# Patient Record
Sex: Female | Born: 1940
Health system: Southern US, Community
[De-identification: ages and names within clinical notes are randomized; demographics above are authoritative.]

## PROBLEM LIST (undated history)

## (undated) DIAGNOSIS — I10 Essential (primary) hypertension: Secondary | ICD-10-CM

## (undated) DIAGNOSIS — I509 Heart failure, unspecified: Secondary | ICD-10-CM

## (undated) DIAGNOSIS — C801 Malignant (primary) neoplasm, unspecified: Secondary | ICD-10-CM

## (undated) DIAGNOSIS — N189 Chronic kidney disease, unspecified: Secondary | ICD-10-CM

## (undated) HISTORY — DX: Chronic kidney disease, unspecified: N18.9

## (undated) HISTORY — PX: CORONARY ARTERY BYPASS GRAFT: SHX141

## (undated) HISTORY — PX: COLONOSCOPY: SHX174

## (undated) HISTORY — DX: Heart failure, unspecified: I50.9

## (undated) HISTORY — DX: Essential (primary) hypertension: I10

---

## 2012-06-09 DIAGNOSIS — E782 Mixed hyperlipidemia: Secondary | ICD-10-CM | POA: Diagnosis not present

## 2012-09-07 DIAGNOSIS — M778 Other enthesopathies, not elsewhere classified: Secondary | ICD-10-CM | POA: Diagnosis not present

## 2012-09-07 DIAGNOSIS — E039 Hypothyroidism, unspecified: Secondary | ICD-10-CM | POA: Diagnosis not present

## 2012-12-12 DIAGNOSIS — E039 Hypothyroidism, unspecified: Secondary | ICD-10-CM | POA: Diagnosis not present

## 2012-12-12 DIAGNOSIS — I10 Essential (primary) hypertension: Secondary | ICD-10-CM | POA: Diagnosis not present

## 2012-12-12 DIAGNOSIS — Z0000 Encounter for general adult medical examination without abnormal findings: Secondary | ICD-10-CM | POA: Diagnosis not present

## 2013-01-27 DIAGNOSIS — J90 Pleural effusion, not elsewhere classified: Secondary | ICD-10-CM | POA: Diagnosis not present

## 2013-01-27 DIAGNOSIS — Z8249 Family history of ischemic heart disease and other diseases of the circulatory system: Secondary | ICD-10-CM | POA: Diagnosis not present

## 2013-01-27 DIAGNOSIS — Z79899 Other long term (current) drug therapy: Secondary | ICD-10-CM | POA: Diagnosis not present

## 2013-01-27 DIAGNOSIS — R0602 Shortness of breath: Secondary | ICD-10-CM | POA: Diagnosis not present

## 2013-01-27 DIAGNOSIS — I2510 Atherosclerotic heart disease of native coronary artery without angina pectoris: Secondary | ICD-10-CM | POA: Diagnosis not present

## 2013-01-27 DIAGNOSIS — I509 Heart failure, unspecified: Secondary | ICD-10-CM | POA: Diagnosis not present

## 2013-01-27 DIAGNOSIS — Z888 Allergy status to other drugs, medicaments and biological substances status: Secondary | ICD-10-CM | POA: Diagnosis not present

## 2013-01-27 DIAGNOSIS — N289 Disorder of kidney and ureter, unspecified: Secondary | ICD-10-CM | POA: Diagnosis not present

## 2013-01-27 DIAGNOSIS — N390 Urinary tract infection, site not specified: Secondary | ICD-10-CM | POA: Diagnosis not present

## 2013-01-27 DIAGNOSIS — Z9861 Coronary angioplasty status: Secondary | ICD-10-CM | POA: Diagnosis not present

## 2013-01-27 DIAGNOSIS — Z87891 Personal history of nicotine dependence: Secondary | ICD-10-CM | POA: Diagnosis not present

## 2013-01-27 DIAGNOSIS — E039 Hypothyroidism, unspecified: Secondary | ICD-10-CM | POA: Diagnosis not present

## 2013-01-27 DIAGNOSIS — I129 Hypertensive chronic kidney disease with stage 1 through stage 4 chronic kidney disease, or unspecified chronic kidney disease: Secondary | ICD-10-CM | POA: Diagnosis not present

## 2013-01-27 DIAGNOSIS — E785 Hyperlipidemia, unspecified: Secondary | ICD-10-CM | POA: Diagnosis not present

## 2013-01-27 DIAGNOSIS — Z23 Encounter for immunization: Secondary | ICD-10-CM | POA: Diagnosis not present

## 2013-01-27 DIAGNOSIS — Z7982 Long term (current) use of aspirin: Secondary | ICD-10-CM | POA: Diagnosis not present

## 2013-01-27 DIAGNOSIS — D649 Anemia, unspecified: Secondary | ICD-10-CM | POA: Diagnosis not present

## 2013-01-27 DIAGNOSIS — I4891 Unspecified atrial fibrillation: Secondary | ICD-10-CM | POA: Diagnosis not present

## 2013-01-27 DIAGNOSIS — Z780 Asymptomatic menopausal state: Secondary | ICD-10-CM | POA: Diagnosis not present

## 2013-01-28 DIAGNOSIS — I129 Hypertensive chronic kidney disease with stage 1 through stage 4 chronic kidney disease, or unspecified chronic kidney disease: Secondary | ICD-10-CM | POA: Diagnosis not present

## 2013-01-28 DIAGNOSIS — R0602 Shortness of breath: Secondary | ICD-10-CM | POA: Diagnosis not present

## 2013-01-28 DIAGNOSIS — I509 Heart failure, unspecified: Secondary | ICD-10-CM | POA: Diagnosis not present

## 2013-01-28 DIAGNOSIS — N390 Urinary tract infection, site not specified: Secondary | ICD-10-CM | POA: Diagnosis not present

## 2013-01-29 DIAGNOSIS — R0602 Shortness of breath: Secondary | ICD-10-CM | POA: Diagnosis not present

## 2013-01-29 DIAGNOSIS — N390 Urinary tract infection, site not specified: Secondary | ICD-10-CM | POA: Diagnosis not present

## 2013-01-29 DIAGNOSIS — I509 Heart failure, unspecified: Secondary | ICD-10-CM | POA: Diagnosis not present

## 2013-01-29 DIAGNOSIS — I129 Hypertensive chronic kidney disease with stage 1 through stage 4 chronic kidney disease, or unspecified chronic kidney disease: Secondary | ICD-10-CM | POA: Diagnosis not present

## 2013-02-12 DIAGNOSIS — I10 Essential (primary) hypertension: Secondary | ICD-10-CM | POA: Diagnosis not present

## 2013-05-21 DIAGNOSIS — E039 Hypothyroidism, unspecified: Secondary | ICD-10-CM | POA: Diagnosis not present

## 2013-05-21 DIAGNOSIS — J209 Acute bronchitis, unspecified: Secondary | ICD-10-CM | POA: Diagnosis not present

## 2013-05-21 DIAGNOSIS — E782 Mixed hyperlipidemia: Secondary | ICD-10-CM | POA: Diagnosis not present

## 2013-05-21 DIAGNOSIS — G932 Benign intracranial hypertension: Secondary | ICD-10-CM | POA: Diagnosis not present

## 2013-08-21 DIAGNOSIS — I509 Heart failure, unspecified: Secondary | ICD-10-CM | POA: Diagnosis not present

## 2013-08-21 DIAGNOSIS — I10 Essential (primary) hypertension: Secondary | ICD-10-CM | POA: Diagnosis not present

## 2013-08-21 DIAGNOSIS — J45909 Unspecified asthma, uncomplicated: Secondary | ICD-10-CM | POA: Diagnosis not present

## 2013-11-26 DIAGNOSIS — I10 Essential (primary) hypertension: Secondary | ICD-10-CM | POA: Diagnosis not present

## 2014-03-04 DIAGNOSIS — Z0000 Encounter for general adult medical examination without abnormal findings: Secondary | ICD-10-CM | POA: Diagnosis not present

## 2014-03-04 DIAGNOSIS — I10 Essential (primary) hypertension: Secondary | ICD-10-CM | POA: Diagnosis not present

## 2014-03-04 DIAGNOSIS — Z79899 Other long term (current) drug therapy: Secondary | ICD-10-CM | POA: Diagnosis not present

## 2014-03-04 DIAGNOSIS — Z1389 Encounter for screening for other disorder: Secondary | ICD-10-CM | POA: Diagnosis not present

## 2014-03-04 DIAGNOSIS — J309 Allergic rhinitis, unspecified: Secondary | ICD-10-CM | POA: Diagnosis not present

## 2014-05-24 DIAGNOSIS — Z1231 Encounter for screening mammogram for malignant neoplasm of breast: Secondary | ICD-10-CM | POA: Diagnosis not present

## 2014-06-21 DIAGNOSIS — I10 Essential (primary) hypertension: Secondary | ICD-10-CM | POA: Diagnosis not present

## 2014-06-21 DIAGNOSIS — J189 Pneumonia, unspecified organism: Secondary | ICD-10-CM | POA: Diagnosis not present

## 2015-04-25 DIAGNOSIS — D539 Nutritional anemia, unspecified: Secondary | ICD-10-CM | POA: Diagnosis not present

## 2015-04-25 DIAGNOSIS — N289 Disorder of kidney and ureter, unspecified: Secondary | ICD-10-CM | POA: Diagnosis not present

## 2015-04-25 DIAGNOSIS — R05 Cough: Secondary | ICD-10-CM | POA: Diagnosis not present

## 2015-04-25 DIAGNOSIS — I5021 Acute systolic (congestive) heart failure: Secondary | ICD-10-CM | POA: Diagnosis not present

## 2015-04-25 DIAGNOSIS — E785 Hyperlipidemia, unspecified: Secondary | ICD-10-CM | POA: Diagnosis not present

## 2015-04-25 DIAGNOSIS — R0602 Shortness of breath: Secondary | ICD-10-CM | POA: Diagnosis not present

## 2015-04-25 DIAGNOSIS — E039 Hypothyroidism, unspecified: Secondary | ICD-10-CM | POA: Diagnosis not present

## 2015-04-25 DIAGNOSIS — R748 Abnormal levels of other serum enzymes: Secondary | ICD-10-CM | POA: Diagnosis not present

## 2015-04-25 DIAGNOSIS — I4891 Unspecified atrial fibrillation: Secondary | ICD-10-CM | POA: Diagnosis not present

## 2015-04-25 DIAGNOSIS — I5023 Acute on chronic systolic (congestive) heart failure: Secondary | ICD-10-CM | POA: Diagnosis not present

## 2015-04-25 DIAGNOSIS — N184 Chronic kidney disease, stage 4 (severe): Secondary | ICD-10-CM | POA: Diagnosis not present

## 2015-04-25 DIAGNOSIS — I129 Hypertensive chronic kidney disease with stage 1 through stage 4 chronic kidney disease, or unspecified chronic kidney disease: Secondary | ICD-10-CM | POA: Diagnosis not present

## 2015-04-25 DIAGNOSIS — I509 Heart failure, unspecified: Secondary | ICD-10-CM | POA: Diagnosis not present

## 2015-04-26 DIAGNOSIS — E038 Other specified hypothyroidism: Secondary | ICD-10-CM | POA: Diagnosis not present

## 2015-04-26 DIAGNOSIS — N183 Chronic kidney disease, stage 3 (moderate): Secondary | ICD-10-CM | POA: Diagnosis not present

## 2015-04-26 DIAGNOSIS — I482 Chronic atrial fibrillation: Secondary | ICD-10-CM | POA: Diagnosis not present

## 2015-04-26 DIAGNOSIS — I5023 Acute on chronic systolic (congestive) heart failure: Secondary | ICD-10-CM | POA: Diagnosis not present

## 2015-04-26 DIAGNOSIS — N184 Chronic kidney disease, stage 4 (severe): Secondary | ICD-10-CM | POA: Diagnosis not present

## 2015-04-27 DIAGNOSIS — E785 Hyperlipidemia, unspecified: Secondary | ICD-10-CM | POA: Diagnosis present

## 2015-04-27 DIAGNOSIS — I129 Hypertensive chronic kidney disease with stage 1 through stage 4 chronic kidney disease, or unspecified chronic kidney disease: Secondary | ICD-10-CM | POA: Diagnosis present

## 2015-04-27 DIAGNOSIS — E039 Hypothyroidism, unspecified: Secondary | ICD-10-CM | POA: Diagnosis present

## 2015-04-27 DIAGNOSIS — D631 Anemia in chronic kidney disease: Secondary | ICD-10-CM | POA: Diagnosis present

## 2015-04-27 DIAGNOSIS — I5023 Acute on chronic systolic (congestive) heart failure: Secondary | ICD-10-CM | POA: Diagnosis not present

## 2015-04-27 DIAGNOSIS — I10 Essential (primary) hypertension: Secondary | ICD-10-CM | POA: Diagnosis present

## 2015-04-27 DIAGNOSIS — I4891 Unspecified atrial fibrillation: Secondary | ICD-10-CM | POA: Diagnosis present

## 2015-04-27 DIAGNOSIS — E038 Other specified hypothyroidism: Secondary | ICD-10-CM | POA: Diagnosis not present

## 2015-04-27 DIAGNOSIS — I482 Chronic atrial fibrillation: Secondary | ICD-10-CM | POA: Diagnosis not present

## 2015-04-27 DIAGNOSIS — Z780 Asymptomatic menopausal state: Secondary | ICD-10-CM | POA: Diagnosis not present

## 2015-04-27 DIAGNOSIS — N184 Chronic kidney disease, stage 4 (severe): Secondary | ICD-10-CM | POA: Diagnosis not present

## 2015-04-27 DIAGNOSIS — I5021 Acute systolic (congestive) heart failure: Secondary | ICD-10-CM | POA: Diagnosis not present

## 2015-04-27 DIAGNOSIS — N183 Chronic kidney disease, stage 3 (moderate): Secondary | ICD-10-CM | POA: Diagnosis not present

## 2015-05-01 DIAGNOSIS — I4891 Unspecified atrial fibrillation: Secondary | ICD-10-CM | POA: Diagnosis not present

## 2015-05-01 DIAGNOSIS — I110 Hypertensive heart disease with heart failure: Secondary | ICD-10-CM | POA: Diagnosis not present

## 2015-05-01 DIAGNOSIS — M6281 Muscle weakness (generalized): Secondary | ICD-10-CM | POA: Diagnosis not present

## 2015-05-01 DIAGNOSIS — R269 Unspecified abnormalities of gait and mobility: Secondary | ICD-10-CM | POA: Diagnosis not present

## 2015-05-01 DIAGNOSIS — I509 Heart failure, unspecified: Secondary | ICD-10-CM | POA: Diagnosis not present

## 2015-05-05 DIAGNOSIS — I110 Hypertensive heart disease with heart failure: Secondary | ICD-10-CM | POA: Diagnosis not present

## 2015-05-05 DIAGNOSIS — R269 Unspecified abnormalities of gait and mobility: Secondary | ICD-10-CM | POA: Diagnosis not present

## 2015-05-05 DIAGNOSIS — I4891 Unspecified atrial fibrillation: Secondary | ICD-10-CM | POA: Diagnosis not present

## 2015-05-05 DIAGNOSIS — M6281 Muscle weakness (generalized): Secondary | ICD-10-CM | POA: Diagnosis not present

## 2015-05-05 DIAGNOSIS — I509 Heart failure, unspecified: Secondary | ICD-10-CM | POA: Diagnosis not present

## 2015-05-07 DIAGNOSIS — R269 Unspecified abnormalities of gait and mobility: Secondary | ICD-10-CM | POA: Diagnosis not present

## 2015-05-07 DIAGNOSIS — I4891 Unspecified atrial fibrillation: Secondary | ICD-10-CM | POA: Diagnosis not present

## 2015-05-07 DIAGNOSIS — I110 Hypertensive heart disease with heart failure: Secondary | ICD-10-CM | POA: Diagnosis not present

## 2015-05-07 DIAGNOSIS — I509 Heart failure, unspecified: Secondary | ICD-10-CM | POA: Diagnosis not present

## 2015-05-07 DIAGNOSIS — M6281 Muscle weakness (generalized): Secondary | ICD-10-CM | POA: Diagnosis not present

## 2015-05-09 DIAGNOSIS — N184 Chronic kidney disease, stage 4 (severe): Secondary | ICD-10-CM | POA: Diagnosis not present

## 2015-05-09 DIAGNOSIS — Z1389 Encounter for screening for other disorder: Secondary | ICD-10-CM | POA: Diagnosis not present

## 2015-05-09 DIAGNOSIS — I5021 Acute systolic (congestive) heart failure: Secondary | ICD-10-CM | POA: Diagnosis not present

## 2015-05-09 DIAGNOSIS — I481 Persistent atrial fibrillation: Secondary | ICD-10-CM | POA: Diagnosis not present

## 2015-05-09 DIAGNOSIS — Z0000 Encounter for general adult medical examination without abnormal findings: Secondary | ICD-10-CM | POA: Diagnosis not present

## 2015-05-12 DIAGNOSIS — I110 Hypertensive heart disease with heart failure: Secondary | ICD-10-CM | POA: Diagnosis not present

## 2015-05-12 DIAGNOSIS — I4891 Unspecified atrial fibrillation: Secondary | ICD-10-CM | POA: Diagnosis not present

## 2015-05-12 DIAGNOSIS — R269 Unspecified abnormalities of gait and mobility: Secondary | ICD-10-CM | POA: Diagnosis not present

## 2015-05-12 DIAGNOSIS — I509 Heart failure, unspecified: Secondary | ICD-10-CM | POA: Diagnosis not present

## 2015-05-12 DIAGNOSIS — M6281 Muscle weakness (generalized): Secondary | ICD-10-CM | POA: Diagnosis not present

## 2015-05-14 DIAGNOSIS — I110 Hypertensive heart disease with heart failure: Secondary | ICD-10-CM | POA: Diagnosis not present

## 2015-05-14 DIAGNOSIS — R269 Unspecified abnormalities of gait and mobility: Secondary | ICD-10-CM | POA: Diagnosis not present

## 2015-05-14 DIAGNOSIS — M6281 Muscle weakness (generalized): Secondary | ICD-10-CM | POA: Diagnosis not present

## 2015-05-14 DIAGNOSIS — I509 Heart failure, unspecified: Secondary | ICD-10-CM | POA: Diagnosis not present

## 2015-05-14 DIAGNOSIS — I4891 Unspecified atrial fibrillation: Secondary | ICD-10-CM | POA: Diagnosis not present

## 2015-05-19 DIAGNOSIS — R269 Unspecified abnormalities of gait and mobility: Secondary | ICD-10-CM | POA: Diagnosis not present

## 2015-05-19 DIAGNOSIS — I4891 Unspecified atrial fibrillation: Secondary | ICD-10-CM | POA: Diagnosis not present

## 2015-05-19 DIAGNOSIS — I509 Heart failure, unspecified: Secondary | ICD-10-CM | POA: Diagnosis not present

## 2015-05-19 DIAGNOSIS — M6281 Muscle weakness (generalized): Secondary | ICD-10-CM | POA: Diagnosis not present

## 2015-05-19 DIAGNOSIS — I110 Hypertensive heart disease with heart failure: Secondary | ICD-10-CM | POA: Diagnosis not present

## 2015-05-21 DIAGNOSIS — R269 Unspecified abnormalities of gait and mobility: Secondary | ICD-10-CM | POA: Diagnosis not present

## 2015-05-21 DIAGNOSIS — M6281 Muscle weakness (generalized): Secondary | ICD-10-CM | POA: Diagnosis not present

## 2015-05-21 DIAGNOSIS — I4891 Unspecified atrial fibrillation: Secondary | ICD-10-CM | POA: Diagnosis not present

## 2015-05-21 DIAGNOSIS — I509 Heart failure, unspecified: Secondary | ICD-10-CM | POA: Diagnosis not present

## 2015-05-21 DIAGNOSIS — I110 Hypertensive heart disease with heart failure: Secondary | ICD-10-CM | POA: Diagnosis not present

## 2015-08-11 DIAGNOSIS — Z131 Encounter for screening for diabetes mellitus: Secondary | ICD-10-CM | POA: Diagnosis not present

## 2015-08-11 DIAGNOSIS — N184 Chronic kidney disease, stage 4 (severe): Secondary | ICD-10-CM | POA: Diagnosis not present

## 2015-08-11 DIAGNOSIS — I481 Persistent atrial fibrillation: Secondary | ICD-10-CM | POA: Diagnosis not present

## 2015-08-11 DIAGNOSIS — I5021 Acute systolic (congestive) heart failure: Secondary | ICD-10-CM | POA: Diagnosis not present

## 2015-12-16 DIAGNOSIS — N184 Chronic kidney disease, stage 4 (severe): Secondary | ICD-10-CM | POA: Diagnosis not present

## 2015-12-16 DIAGNOSIS — Z131 Encounter for screening for diabetes mellitus: Secondary | ICD-10-CM | POA: Diagnosis not present

## 2015-12-16 DIAGNOSIS — I481 Persistent atrial fibrillation: Secondary | ICD-10-CM | POA: Diagnosis not present

## 2015-12-16 DIAGNOSIS — J4521 Mild intermittent asthma with (acute) exacerbation: Secondary | ICD-10-CM | POA: Diagnosis not present

## 2015-12-16 DIAGNOSIS — I5022 Chronic systolic (congestive) heart failure: Secondary | ICD-10-CM | POA: Diagnosis not present

## 2016-02-19 DIAGNOSIS — J9801 Acute bronchospasm: Secondary | ICD-10-CM | POA: Diagnosis not present

## 2016-02-19 DIAGNOSIS — J209 Acute bronchitis, unspecified: Secondary | ICD-10-CM | POA: Diagnosis not present

## 2016-02-19 DIAGNOSIS — I169 Hypertensive crisis, unspecified: Secondary | ICD-10-CM | POA: Diagnosis not present

## 2016-02-20 DIAGNOSIS — Z8673 Personal history of transient ischemic attack (TIA), and cerebral infarction without residual deficits: Secondary | ICD-10-CM | POA: Diagnosis not present

## 2016-02-20 DIAGNOSIS — I517 Cardiomegaly: Secondary | ICD-10-CM | POA: Diagnosis not present

## 2016-02-20 DIAGNOSIS — I482 Chronic atrial fibrillation: Secondary | ICD-10-CM | POA: Diagnosis not present

## 2016-02-20 DIAGNOSIS — Z7902 Long term (current) use of antithrombotics/antiplatelets: Secondary | ICD-10-CM | POA: Diagnosis not present

## 2016-02-20 DIAGNOSIS — E039 Hypothyroidism, unspecified: Secondary | ICD-10-CM | POA: Diagnosis not present

## 2016-02-20 DIAGNOSIS — M1990 Unspecified osteoarthritis, unspecified site: Secondary | ICD-10-CM | POA: Diagnosis not present

## 2016-02-20 DIAGNOSIS — J4521 Mild intermittent asthma with (acute) exacerbation: Secondary | ICD-10-CM | POA: Diagnosis not present

## 2016-02-20 DIAGNOSIS — I252 Old myocardial infarction: Secondary | ICD-10-CM | POA: Diagnosis not present

## 2016-02-20 DIAGNOSIS — Z79899 Other long term (current) drug therapy: Secondary | ICD-10-CM | POA: Diagnosis not present

## 2016-02-20 DIAGNOSIS — I10 Essential (primary) hypertension: Secondary | ICD-10-CM | POA: Diagnosis not present

## 2016-02-20 DIAGNOSIS — R0602 Shortness of breath: Secondary | ICD-10-CM | POA: Diagnosis not present

## 2016-02-20 DIAGNOSIS — E785 Hyperlipidemia, unspecified: Secondary | ICD-10-CM | POA: Diagnosis not present

## 2016-05-17 DIAGNOSIS — I481 Persistent atrial fibrillation: Secondary | ICD-10-CM | POA: Diagnosis not present

## 2016-05-17 DIAGNOSIS — Z1389 Encounter for screening for other disorder: Secondary | ICD-10-CM | POA: Diagnosis not present

## 2016-05-17 DIAGNOSIS — N184 Chronic kidney disease, stage 4 (severe): Secondary | ICD-10-CM | POA: Diagnosis not present

## 2016-05-17 DIAGNOSIS — I5022 Chronic systolic (congestive) heart failure: Secondary | ICD-10-CM | POA: Diagnosis not present

## 2016-05-17 DIAGNOSIS — J4521 Mild intermittent asthma with (acute) exacerbation: Secondary | ICD-10-CM | POA: Diagnosis not present

## 2016-05-17 DIAGNOSIS — Z0000 Encounter for general adult medical examination without abnormal findings: Secondary | ICD-10-CM | POA: Diagnosis not present

## 2016-06-03 DIAGNOSIS — I10 Essential (primary) hypertension: Secondary | ICD-10-CM | POA: Diagnosis not present

## 2016-06-03 DIAGNOSIS — H40013 Open angle with borderline findings, low risk, bilateral: Secondary | ICD-10-CM | POA: Diagnosis not present

## 2016-06-03 DIAGNOSIS — H25013 Cortical age-related cataract, bilateral: Secondary | ICD-10-CM | POA: Diagnosis not present

## 2016-06-03 DIAGNOSIS — H524 Presbyopia: Secondary | ICD-10-CM | POA: Diagnosis not present

## 2016-06-03 DIAGNOSIS — H35033 Hypertensive retinopathy, bilateral: Secondary | ICD-10-CM | POA: Diagnosis not present

## 2016-06-03 DIAGNOSIS — H11153 Pinguecula, bilateral: Secondary | ICD-10-CM | POA: Diagnosis not present

## 2016-06-03 DIAGNOSIS — H18413 Arcus senilis, bilateral: Secondary | ICD-10-CM | POA: Diagnosis not present

## 2016-06-03 DIAGNOSIS — H2513 Age-related nuclear cataract, bilateral: Secondary | ICD-10-CM | POA: Diagnosis not present

## 2016-06-14 DIAGNOSIS — I481 Persistent atrial fibrillation: Secondary | ICD-10-CM | POA: Diagnosis not present

## 2016-06-14 DIAGNOSIS — Z79899 Other long term (current) drug therapy: Secondary | ICD-10-CM | POA: Diagnosis not present

## 2016-06-14 DIAGNOSIS — N184 Chronic kidney disease, stage 4 (severe): Secondary | ICD-10-CM | POA: Diagnosis not present

## 2016-06-14 DIAGNOSIS — J4521 Mild intermittent asthma with (acute) exacerbation: Secondary | ICD-10-CM | POA: Diagnosis not present

## 2016-06-14 DIAGNOSIS — I5022 Chronic systolic (congestive) heart failure: Secondary | ICD-10-CM | POA: Diagnosis not present

## 2016-09-16 DIAGNOSIS — I481 Persistent atrial fibrillation: Secondary | ICD-10-CM | POA: Diagnosis not present

## 2016-09-16 DIAGNOSIS — M85852 Other specified disorders of bone density and structure, left thigh: Secondary | ICD-10-CM | POA: Diagnosis not present

## 2016-09-16 DIAGNOSIS — M810 Age-related osteoporosis without current pathological fracture: Secondary | ICD-10-CM | POA: Diagnosis not present

## 2016-09-16 DIAGNOSIS — J4521 Mild intermittent asthma with (acute) exacerbation: Secondary | ICD-10-CM | POA: Diagnosis not present

## 2016-09-16 DIAGNOSIS — N184 Chronic kidney disease, stage 4 (severe): Secondary | ICD-10-CM | POA: Diagnosis not present

## 2016-09-16 DIAGNOSIS — I5022 Chronic systolic (congestive) heart failure: Secondary | ICD-10-CM | POA: Diagnosis not present

## 2017-09-27 DIAGNOSIS — I5022 Chronic systolic (congestive) heart failure: Secondary | ICD-10-CM | POA: Diagnosis not present

## 2017-09-27 DIAGNOSIS — I10 Essential (primary) hypertension: Secondary | ICD-10-CM | POA: Diagnosis not present

## 2017-09-27 DIAGNOSIS — J4521 Mild intermittent asthma with (acute) exacerbation: Secondary | ICD-10-CM | POA: Diagnosis not present

## 2017-09-27 DIAGNOSIS — I481 Persistent atrial fibrillation: Secondary | ICD-10-CM | POA: Diagnosis not present

## 2017-09-27 DIAGNOSIS — N184 Chronic kidney disease, stage 4 (severe): Secondary | ICD-10-CM | POA: Diagnosis not present

## 2017-09-27 DIAGNOSIS — Z6832 Body mass index (BMI) 32.0-32.9, adult: Secondary | ICD-10-CM | POA: Diagnosis not present

## 2018-01-09 DIAGNOSIS — I10 Essential (primary) hypertension: Secondary | ICD-10-CM | POA: Diagnosis not present

## 2018-01-09 DIAGNOSIS — Z6832 Body mass index (BMI) 32.0-32.9, adult: Secondary | ICD-10-CM | POA: Diagnosis not present

## 2018-01-09 DIAGNOSIS — Z0000 Encounter for general adult medical examination without abnormal findings: Secondary | ICD-10-CM | POA: Diagnosis not present

## 2018-01-09 DIAGNOSIS — N184 Chronic kidney disease, stage 4 (severe): Secondary | ICD-10-CM | POA: Diagnosis not present

## 2018-01-09 DIAGNOSIS — I481 Persistent atrial fibrillation: Secondary | ICD-10-CM | POA: Diagnosis not present

## 2018-01-09 DIAGNOSIS — Z1389 Encounter for screening for other disorder: Secondary | ICD-10-CM | POA: Diagnosis not present

## 2018-01-09 DIAGNOSIS — I5022 Chronic systolic (congestive) heart failure: Secondary | ICD-10-CM | POA: Diagnosis not present

## 2018-01-09 DIAGNOSIS — J4521 Mild intermittent asthma with (acute) exacerbation: Secondary | ICD-10-CM | POA: Diagnosis not present

## 2018-02-28 DIAGNOSIS — I129 Hypertensive chronic kidney disease with stage 1 through stage 4 chronic kidney disease, or unspecified chronic kidney disease: Secondary | ICD-10-CM | POA: Diagnosis not present

## 2018-02-28 DIAGNOSIS — N189 Chronic kidney disease, unspecified: Secondary | ICD-10-CM | POA: Diagnosis not present

## 2018-02-28 DIAGNOSIS — J45909 Unspecified asthma, uncomplicated: Secondary | ICD-10-CM | POA: Diagnosis not present

## 2018-02-28 DIAGNOSIS — Z6834 Body mass index (BMI) 34.0-34.9, adult: Secondary | ICD-10-CM | POA: Diagnosis not present

## 2018-02-28 DIAGNOSIS — E785 Hyperlipidemia, unspecified: Secondary | ICD-10-CM | POA: Diagnosis not present

## 2018-02-28 DIAGNOSIS — I25119 Atherosclerotic heart disease of native coronary artery with unspecified angina pectoris: Secondary | ICD-10-CM | POA: Diagnosis not present

## 2018-02-28 DIAGNOSIS — R413 Other amnesia: Secondary | ICD-10-CM | POA: Diagnosis not present

## 2018-02-28 DIAGNOSIS — M810 Age-related osteoporosis without current pathological fracture: Secondary | ICD-10-CM | POA: Diagnosis not present

## 2018-02-28 DIAGNOSIS — E669 Obesity, unspecified: Secondary | ICD-10-CM | POA: Diagnosis not present

## 2018-03-16 DIAGNOSIS — H25813 Combined forms of age-related cataract, bilateral: Secondary | ICD-10-CM | POA: Diagnosis not present

## 2018-03-16 DIAGNOSIS — H16223 Keratoconjunctivitis sicca, not specified as Sjogren's, bilateral: Secondary | ICD-10-CM | POA: Diagnosis not present

## 2018-03-20 DIAGNOSIS — H40013 Open angle with borderline findings, low risk, bilateral: Secondary | ICD-10-CM | POA: Diagnosis not present

## 2018-03-20 DIAGNOSIS — H2513 Age-related nuclear cataract, bilateral: Secondary | ICD-10-CM | POA: Diagnosis not present

## 2018-04-10 DIAGNOSIS — I10 Essential (primary) hypertension: Secondary | ICD-10-CM | POA: Diagnosis not present

## 2018-04-10 DIAGNOSIS — N184 Chronic kidney disease, stage 4 (severe): Secondary | ICD-10-CM | POA: Diagnosis not present

## 2018-04-10 DIAGNOSIS — Z6831 Body mass index (BMI) 31.0-31.9, adult: Secondary | ICD-10-CM | POA: Diagnosis not present

## 2018-04-10 DIAGNOSIS — J4521 Mild intermittent asthma with (acute) exacerbation: Secondary | ICD-10-CM | POA: Diagnosis not present

## 2018-04-10 DIAGNOSIS — I4811 Longstanding persistent atrial fibrillation: Secondary | ICD-10-CM | POA: Diagnosis not present

## 2018-04-10 DIAGNOSIS — I5022 Chronic systolic (congestive) heart failure: Secondary | ICD-10-CM | POA: Diagnosis not present

## 2018-04-25 DIAGNOSIS — H2513 Age-related nuclear cataract, bilateral: Secondary | ICD-10-CM | POA: Diagnosis not present

## 2018-04-25 DIAGNOSIS — H401132 Primary open-angle glaucoma, bilateral, moderate stage: Secondary | ICD-10-CM | POA: Diagnosis not present

## 2018-07-13 DIAGNOSIS — I4819 Other persistent atrial fibrillation: Secondary | ICD-10-CM | POA: Diagnosis not present

## 2018-07-13 DIAGNOSIS — Z6832 Body mass index (BMI) 32.0-32.9, adult: Secondary | ICD-10-CM | POA: Diagnosis not present

## 2018-07-13 DIAGNOSIS — N184 Chronic kidney disease, stage 4 (severe): Secondary | ICD-10-CM | POA: Diagnosis not present

## 2018-07-13 DIAGNOSIS — Z0000 Encounter for general adult medical examination without abnormal findings: Secondary | ICD-10-CM | POA: Diagnosis not present

## 2018-07-13 DIAGNOSIS — I4811 Longstanding persistent atrial fibrillation: Secondary | ICD-10-CM | POA: Diagnosis not present

## 2018-07-13 DIAGNOSIS — I10 Essential (primary) hypertension: Secondary | ICD-10-CM | POA: Diagnosis not present

## 2018-07-13 DIAGNOSIS — J4521 Mild intermittent asthma with (acute) exacerbation: Secondary | ICD-10-CM | POA: Diagnosis not present

## 2018-07-13 DIAGNOSIS — I5022 Chronic systolic (congestive) heart failure: Secondary | ICD-10-CM | POA: Diagnosis not present

## 2018-09-29 DIAGNOSIS — Z1231 Encounter for screening mammogram for malignant neoplasm of breast: Secondary | ICD-10-CM | POA: Diagnosis not present

## 2018-10-04 DIAGNOSIS — N6311 Unspecified lump in the right breast, upper outer quadrant: Secondary | ICD-10-CM | POA: Diagnosis not present

## 2018-10-04 DIAGNOSIS — Z170 Estrogen receptor positive status [ER+]: Secondary | ICD-10-CM | POA: Diagnosis not present

## 2018-10-04 DIAGNOSIS — N6489 Other specified disorders of breast: Secondary | ICD-10-CM | POA: Diagnosis not present

## 2018-10-04 DIAGNOSIS — C50411 Malignant neoplasm of upper-outer quadrant of right female breast: Secondary | ICD-10-CM | POA: Diagnosis not present

## 2018-10-04 DIAGNOSIS — R928 Other abnormal and inconclusive findings on diagnostic imaging of breast: Secondary | ICD-10-CM | POA: Diagnosis not present

## 2018-11-01 DIAGNOSIS — C50911 Malignant neoplasm of unspecified site of right female breast: Secondary | ICD-10-CM | POA: Diagnosis not present

## 2018-11-02 DIAGNOSIS — N184 Chronic kidney disease, stage 4 (severe): Secondary | ICD-10-CM | POA: Diagnosis not present

## 2018-11-02 DIAGNOSIS — I5022 Chronic systolic (congestive) heart failure: Secondary | ICD-10-CM | POA: Diagnosis not present

## 2018-11-02 DIAGNOSIS — Z6832 Body mass index (BMI) 32.0-32.9, adult: Secondary | ICD-10-CM | POA: Diagnosis not present

## 2018-11-02 DIAGNOSIS — I10 Essential (primary) hypertension: Secondary | ICD-10-CM | POA: Diagnosis not present

## 2018-11-02 DIAGNOSIS — J4521 Mild intermittent asthma with (acute) exacerbation: Secondary | ICD-10-CM | POA: Diagnosis not present

## 2018-11-07 DIAGNOSIS — C50911 Malignant neoplasm of unspecified site of right female breast: Secondary | ICD-10-CM | POA: Diagnosis not present

## 2018-11-10 DIAGNOSIS — Z1159 Encounter for screening for other viral diseases: Secondary | ICD-10-CM | POA: Diagnosis not present

## 2018-11-10 DIAGNOSIS — Z01818 Encounter for other preprocedural examination: Secondary | ICD-10-CM | POA: Diagnosis not present

## 2018-11-14 DIAGNOSIS — C50911 Malignant neoplasm of unspecified site of right female breast: Secondary | ICD-10-CM | POA: Diagnosis not present

## 2018-11-14 DIAGNOSIS — E7800 Pure hypercholesterolemia, unspecified: Secondary | ICD-10-CM | POA: Diagnosis not present

## 2018-11-14 DIAGNOSIS — Z7982 Long term (current) use of aspirin: Secondary | ICD-10-CM | POA: Diagnosis not present

## 2018-11-14 DIAGNOSIS — M1990 Unspecified osteoarthritis, unspecified site: Secondary | ICD-10-CM | POA: Diagnosis not present

## 2018-11-14 DIAGNOSIS — I10 Essential (primary) hypertension: Secondary | ICD-10-CM | POA: Diagnosis not present

## 2018-11-14 DIAGNOSIS — Z79899 Other long term (current) drug therapy: Secondary | ICD-10-CM | POA: Diagnosis not present

## 2018-11-14 DIAGNOSIS — Z170 Estrogen receptor positive status [ER+]: Secondary | ICD-10-CM | POA: Diagnosis not present

## 2018-11-15 DIAGNOSIS — Z170 Estrogen receptor positive status [ER+]: Secondary | ICD-10-CM | POA: Diagnosis not present

## 2018-11-15 DIAGNOSIS — Z79899 Other long term (current) drug therapy: Secondary | ICD-10-CM | POA: Diagnosis not present

## 2018-11-15 DIAGNOSIS — E7800 Pure hypercholesterolemia, unspecified: Secondary | ICD-10-CM | POA: Diagnosis not present

## 2018-11-15 DIAGNOSIS — I10 Essential (primary) hypertension: Secondary | ICD-10-CM | POA: Diagnosis not present

## 2018-11-15 DIAGNOSIS — C50911 Malignant neoplasm of unspecified site of right female breast: Secondary | ICD-10-CM | POA: Diagnosis not present

## 2018-11-15 DIAGNOSIS — M1990 Unspecified osteoarthritis, unspecified site: Secondary | ICD-10-CM | POA: Diagnosis not present

## 2018-11-15 DIAGNOSIS — Z7982 Long term (current) use of aspirin: Secondary | ICD-10-CM | POA: Diagnosis not present

## 2018-11-15 DIAGNOSIS — C50411 Malignant neoplasm of upper-outer quadrant of right female breast: Secondary | ICD-10-CM | POA: Diagnosis not present

## 2018-11-16 DIAGNOSIS — C50911 Malignant neoplasm of unspecified site of right female breast: Secondary | ICD-10-CM | POA: Diagnosis not present

## 2018-11-16 DIAGNOSIS — Z170 Estrogen receptor positive status [ER+]: Secondary | ICD-10-CM | POA: Diagnosis not present

## 2018-11-16 DIAGNOSIS — Z7982 Long term (current) use of aspirin: Secondary | ICD-10-CM | POA: Diagnosis not present

## 2018-11-16 DIAGNOSIS — E7800 Pure hypercholesterolemia, unspecified: Secondary | ICD-10-CM | POA: Diagnosis not present

## 2018-11-16 DIAGNOSIS — Z79899 Other long term (current) drug therapy: Secondary | ICD-10-CM | POA: Diagnosis not present

## 2018-11-16 DIAGNOSIS — M1990 Unspecified osteoarthritis, unspecified site: Secondary | ICD-10-CM | POA: Diagnosis not present

## 2018-11-16 DIAGNOSIS — I10 Essential (primary) hypertension: Secondary | ICD-10-CM | POA: Diagnosis not present

## 2018-11-27 DIAGNOSIS — Z6833 Body mass index (BMI) 33.0-33.9, adult: Secondary | ICD-10-CM | POA: Diagnosis not present

## 2018-11-27 DIAGNOSIS — J4521 Mild intermittent asthma with (acute) exacerbation: Secondary | ICD-10-CM | POA: Diagnosis not present

## 2018-11-27 DIAGNOSIS — G3184 Mild cognitive impairment, so stated: Secondary | ICD-10-CM | POA: Diagnosis not present

## 2018-11-27 DIAGNOSIS — Z79899 Other long term (current) drug therapy: Secondary | ICD-10-CM | POA: Diagnosis not present

## 2018-11-27 DIAGNOSIS — I5022 Chronic systolic (congestive) heart failure: Secondary | ICD-10-CM | POA: Diagnosis not present

## 2018-11-27 DIAGNOSIS — N184 Chronic kidney disease, stage 4 (severe): Secondary | ICD-10-CM | POA: Diagnosis not present

## 2018-11-27 DIAGNOSIS — I10 Essential (primary) hypertension: Secondary | ICD-10-CM | POA: Diagnosis not present

## 2018-11-27 DIAGNOSIS — I4819 Other persistent atrial fibrillation: Secondary | ICD-10-CM | POA: Diagnosis not present

## 2018-11-30 DIAGNOSIS — G3184 Mild cognitive impairment, so stated: Secondary | ICD-10-CM | POA: Diagnosis not present

## 2018-12-01 DIAGNOSIS — C50919 Malignant neoplasm of unspecified site of unspecified female breast: Secondary | ICD-10-CM | POA: Diagnosis not present

## 2018-12-07 DIAGNOSIS — C50911 Malignant neoplasm of unspecified site of right female breast: Secondary | ICD-10-CM | POA: Diagnosis not present

## 2018-12-11 DIAGNOSIS — Z170 Estrogen receptor positive status [ER+]: Secondary | ICD-10-CM | POA: Diagnosis not present

## 2018-12-11 DIAGNOSIS — C50911 Malignant neoplasm of unspecified site of right female breast: Secondary | ICD-10-CM | POA: Diagnosis not present

## 2018-12-21 DIAGNOSIS — E785 Hyperlipidemia, unspecified: Secondary | ICD-10-CM | POA: Diagnosis not present

## 2018-12-21 DIAGNOSIS — Z87891 Personal history of nicotine dependence: Secondary | ICD-10-CM | POA: Diagnosis not present

## 2018-12-21 DIAGNOSIS — D631 Anemia in chronic kidney disease: Secondary | ICD-10-CM | POA: Diagnosis not present

## 2018-12-21 DIAGNOSIS — I129 Hypertensive chronic kidney disease with stage 1 through stage 4 chronic kidney disease, or unspecified chronic kidney disease: Secondary | ICD-10-CM | POA: Diagnosis not present

## 2018-12-21 DIAGNOSIS — E039 Hypothyroidism, unspecified: Secondary | ICD-10-CM | POA: Diagnosis not present

## 2018-12-21 DIAGNOSIS — C50919 Malignant neoplasm of unspecified site of unspecified female breast: Secondary | ICD-10-CM | POA: Diagnosis not present

## 2018-12-21 DIAGNOSIS — Z170 Estrogen receptor positive status [ER+]: Secondary | ICD-10-CM | POA: Diagnosis not present

## 2018-12-21 DIAGNOSIS — C50911 Malignant neoplasm of unspecified site of right female breast: Secondary | ICD-10-CM | POA: Diagnosis not present

## 2018-12-21 DIAGNOSIS — Z951 Presence of aortocoronary bypass graft: Secondary | ICD-10-CM | POA: Diagnosis not present

## 2019-01-04 DIAGNOSIS — Z951 Presence of aortocoronary bypass graft: Secondary | ICD-10-CM | POA: Diagnosis not present

## 2019-01-04 DIAGNOSIS — Z9011 Acquired absence of right breast and nipple: Secondary | ICD-10-CM | POA: Diagnosis not present

## 2019-01-04 DIAGNOSIS — I252 Old myocardial infarction: Secondary | ICD-10-CM | POA: Diagnosis not present

## 2019-01-04 DIAGNOSIS — I10 Essential (primary) hypertension: Secondary | ICD-10-CM | POA: Diagnosis not present

## 2019-01-04 DIAGNOSIS — E039 Hypothyroidism, unspecified: Secondary | ICD-10-CM | POA: Diagnosis not present

## 2019-01-04 DIAGNOSIS — E7800 Pure hypercholesterolemia, unspecified: Secondary | ICD-10-CM | POA: Diagnosis not present

## 2019-01-04 DIAGNOSIS — Z170 Estrogen receptor positive status [ER+]: Secondary | ICD-10-CM | POA: Diagnosis not present

## 2019-01-04 DIAGNOSIS — Z8673 Personal history of transient ischemic attack (TIA), and cerebral infarction without residual deficits: Secondary | ICD-10-CM | POA: Diagnosis not present

## 2019-01-04 DIAGNOSIS — C50411 Malignant neoplasm of upper-outer quadrant of right female breast: Secondary | ICD-10-CM | POA: Diagnosis not present

## 2019-01-15 DIAGNOSIS — H34831 Tributary (branch) retinal vein occlusion, right eye, with macular edema: Secondary | ICD-10-CM | POA: Diagnosis not present

## 2019-01-15 DIAGNOSIS — H25812 Combined forms of age-related cataract, left eye: Secondary | ICD-10-CM | POA: Diagnosis not present

## 2019-01-15 DIAGNOSIS — H401132 Primary open-angle glaucoma, bilateral, moderate stage: Secondary | ICD-10-CM | POA: Diagnosis not present

## 2019-01-15 DIAGNOSIS — H35033 Hypertensive retinopathy, bilateral: Secondary | ICD-10-CM | POA: Diagnosis not present

## 2019-01-15 DIAGNOSIS — H25813 Combined forms of age-related cataract, bilateral: Secondary | ICD-10-CM | POA: Diagnosis not present

## 2019-01-19 DIAGNOSIS — C50911 Malignant neoplasm of unspecified site of right female breast: Secondary | ICD-10-CM | POA: Diagnosis not present

## 2019-01-30 DIAGNOSIS — H2512 Age-related nuclear cataract, left eye: Secondary | ICD-10-CM | POA: Diagnosis not present

## 2019-01-30 DIAGNOSIS — H25012 Cortical age-related cataract, left eye: Secondary | ICD-10-CM | POA: Diagnosis not present

## 2019-02-08 DIAGNOSIS — E785 Hyperlipidemia, unspecified: Secondary | ICD-10-CM | POA: Diagnosis not present

## 2019-02-08 DIAGNOSIS — Z951 Presence of aortocoronary bypass graft: Secondary | ICD-10-CM | POA: Diagnosis not present

## 2019-02-08 DIAGNOSIS — Z9011 Acquired absence of right breast and nipple: Secondary | ICD-10-CM | POA: Diagnosis not present

## 2019-02-08 DIAGNOSIS — I129 Hypertensive chronic kidney disease with stage 1 through stage 4 chronic kidney disease, or unspecified chronic kidney disease: Secondary | ICD-10-CM | POA: Diagnosis not present

## 2019-02-08 DIAGNOSIS — C50919 Malignant neoplasm of unspecified site of unspecified female breast: Secondary | ICD-10-CM | POA: Diagnosis not present

## 2019-02-08 DIAGNOSIS — D631 Anemia in chronic kidney disease: Secondary | ICD-10-CM | POA: Diagnosis not present

## 2019-02-08 DIAGNOSIS — Z170 Estrogen receptor positive status [ER+]: Secondary | ICD-10-CM | POA: Diagnosis not present

## 2019-02-08 DIAGNOSIS — E039 Hypothyroidism, unspecified: Secondary | ICD-10-CM | POA: Diagnosis not present

## 2019-02-08 DIAGNOSIS — N189 Chronic kidney disease, unspecified: Secondary | ICD-10-CM | POA: Diagnosis not present

## 2019-02-08 DIAGNOSIS — C50411 Malignant neoplasm of upper-outer quadrant of right female breast: Secondary | ICD-10-CM | POA: Diagnosis not present

## 2019-02-21 DIAGNOSIS — Z961 Presence of intraocular lens: Secondary | ICD-10-CM | POA: Diagnosis not present

## 2019-02-21 DIAGNOSIS — H34831 Tributary (branch) retinal vein occlusion, right eye, with macular edema: Secondary | ICD-10-CM | POA: Diagnosis not present

## 2019-02-21 DIAGNOSIS — H2511 Age-related nuclear cataract, right eye: Secondary | ICD-10-CM | POA: Diagnosis not present

## 2019-02-21 DIAGNOSIS — H401132 Primary open-angle glaucoma, bilateral, moderate stage: Secondary | ICD-10-CM | POA: Diagnosis not present

## 2019-02-27 DIAGNOSIS — I10 Essential (primary) hypertension: Secondary | ICD-10-CM | POA: Diagnosis not present

## 2019-02-27 DIAGNOSIS — N184 Chronic kidney disease, stage 4 (severe): Secondary | ICD-10-CM | POA: Diagnosis not present

## 2019-02-27 DIAGNOSIS — I480 Paroxysmal atrial fibrillation: Secondary | ICD-10-CM | POA: Diagnosis not present

## 2019-02-27 DIAGNOSIS — Z0000 Encounter for general adult medical examination without abnormal findings: Secondary | ICD-10-CM | POA: Diagnosis not present

## 2019-02-27 DIAGNOSIS — G3184 Mild cognitive impairment, so stated: Secondary | ICD-10-CM | POA: Diagnosis not present

## 2019-02-27 DIAGNOSIS — F0280 Dementia in other diseases classified elsewhere without behavioral disturbance: Secondary | ICD-10-CM | POA: Diagnosis not present

## 2019-02-27 DIAGNOSIS — I5022 Chronic systolic (congestive) heart failure: Secondary | ICD-10-CM | POA: Diagnosis not present

## 2019-02-27 DIAGNOSIS — J4521 Mild intermittent asthma with (acute) exacerbation: Secondary | ICD-10-CM | POA: Diagnosis not present

## 2019-02-27 DIAGNOSIS — Z6830 Body mass index (BMI) 30.0-30.9, adult: Secondary | ICD-10-CM | POA: Diagnosis not present

## 2019-03-06 DIAGNOSIS — I739 Peripheral vascular disease, unspecified: Secondary | ICD-10-CM | POA: Diagnosis not present

## 2019-03-21 DIAGNOSIS — H34831 Tributary (branch) retinal vein occlusion, right eye, with macular edema: Secondary | ICD-10-CM | POA: Diagnosis not present

## 2019-03-28 ENCOUNTER — Encounter: Payer: Self-pay | Admitting: Vascular Surgery

## 2020-05-14 ENCOUNTER — Other Ambulatory Visit: Payer: Self-pay

## 2020-05-14 ENCOUNTER — Emergency Department (HOSPITAL_COMMUNITY): Payer: Medicare Other

## 2020-05-14 ENCOUNTER — Encounter (HOSPITAL_COMMUNITY): Payer: Self-pay

## 2020-05-14 ENCOUNTER — Inpatient Hospital Stay (HOSPITAL_COMMUNITY)
Admission: EM | Admit: 2020-05-14 | Discharge: 2020-05-22 | DRG: 673 | Disposition: A | Discharge status: Home Health | Payer: Medicare Other | Attending: Family Medicine | Admitting: Family Medicine

## 2020-05-14 DIAGNOSIS — Z7901 Long term (current) use of anticoagulants: Secondary | ICD-10-CM | POA: Diagnosis not present

## 2020-05-14 DIAGNOSIS — I10 Essential (primary) hypertension: Secondary | ICD-10-CM | POA: Diagnosis not present

## 2020-05-14 DIAGNOSIS — Z95828 Presence of other vascular implants and grafts: Secondary | ICD-10-CM | POA: Diagnosis not present

## 2020-05-14 DIAGNOSIS — E785 Hyperlipidemia, unspecified: Secondary | ICD-10-CM | POA: Diagnosis present

## 2020-05-14 DIAGNOSIS — E875 Hyperkalemia: Secondary | ICD-10-CM

## 2020-05-14 DIAGNOSIS — N2581 Secondary hyperparathyroidism of renal origin: Secondary | ICD-10-CM | POA: Diagnosis present

## 2020-05-14 DIAGNOSIS — J1282 Pneumonia due to coronavirus disease 2019: Secondary | ICD-10-CM | POA: Diagnosis present

## 2020-05-14 DIAGNOSIS — I4891 Unspecified atrial fibrillation: Secondary | ICD-10-CM

## 2020-05-14 DIAGNOSIS — R7989 Other specified abnormal findings of blood chemistry: Secondary | ICD-10-CM

## 2020-05-14 DIAGNOSIS — M898X9 Other specified disorders of bone, unspecified site: Secondary | ICD-10-CM | POA: Diagnosis present

## 2020-05-14 DIAGNOSIS — U071 COVID-19: Secondary | ICD-10-CM | POA: Diagnosis present

## 2020-05-14 DIAGNOSIS — B3781 Candidal esophagitis: Secondary | ICD-10-CM | POA: Diagnosis present

## 2020-05-14 DIAGNOSIS — Z87891 Personal history of nicotine dependence: Secondary | ICD-10-CM

## 2020-05-14 DIAGNOSIS — Z951 Presence of aortocoronary bypass graft: Secondary | ICD-10-CM | POA: Diagnosis not present

## 2020-05-14 DIAGNOSIS — I509 Heart failure, unspecified: Secondary | ICD-10-CM | POA: Diagnosis present

## 2020-05-14 DIAGNOSIS — F0390 Unspecified dementia without behavioral disturbance: Secondary | ICD-10-CM | POA: Diagnosis present

## 2020-05-14 DIAGNOSIS — Z79899 Other long term (current) drug therapy: Secondary | ICD-10-CM

## 2020-05-14 DIAGNOSIS — N186 End stage renal disease: Secondary | ICD-10-CM | POA: Diagnosis present

## 2020-05-14 DIAGNOSIS — N179 Acute kidney failure, unspecified: Principal | ICD-10-CM

## 2020-05-14 DIAGNOSIS — I2510 Atherosclerotic heart disease of native coronary artery without angina pectoris: Secondary | ICD-10-CM | POA: Diagnosis present

## 2020-05-14 DIAGNOSIS — N185 Chronic kidney disease, stage 5: Secondary | ICD-10-CM

## 2020-05-14 DIAGNOSIS — I482 Chronic atrial fibrillation, unspecified: Secondary | ICD-10-CM | POA: Diagnosis present

## 2020-05-14 DIAGNOSIS — I132 Hypertensive heart and chronic kidney disease with heart failure and with stage 5 chronic kidney disease, or end stage renal disease: Secondary | ICD-10-CM | POA: Diagnosis present

## 2020-05-14 DIAGNOSIS — D631 Anemia in chronic kidney disease: Secondary | ICD-10-CM | POA: Diagnosis present

## 2020-05-14 DIAGNOSIS — Z992 Dependence on renal dialysis: Secondary | ICD-10-CM | POA: Diagnosis not present

## 2020-05-14 DIAGNOSIS — E039 Hypothyroidism, unspecified: Secondary | ICD-10-CM | POA: Diagnosis present

## 2020-05-14 HISTORY — DX: Malignant (primary) neoplasm, unspecified: C80.1

## 2020-05-14 LAB — BASIC METABOLIC PANEL
Anion gap: 17 — ABNORMAL HIGH (ref 5–15)
BUN: 177 mg/dL — ABNORMAL HIGH (ref 8–23)
CO2: 15 mmol/L — ABNORMAL LOW (ref 22–32)
Calcium: 9 mg/dL (ref 8.9–10.3)
Chloride: 107 mmol/L (ref 98–111)
Creatinine, Ser: 27.78 mg/dL — ABNORMAL HIGH (ref 0.44–1.00)
Glucose, Bld: 119 mg/dL — ABNORMAL HIGH (ref 70–99)
Potassium: 6.6 mmol/L (ref 3.5–5.1)
Sodium: 139 mmol/L (ref 135–145)

## 2020-05-14 LAB — CBC WITH DIFFERENTIAL/PLATELET
Abs Immature Granulocytes: 0.09 10*3/uL — ABNORMAL HIGH (ref 0.00–0.07)
Basophils Absolute: 0 10*3/uL (ref 0.0–0.1)
Basophils Relative: 0 %
Eosinophils Absolute: 0.1 10*3/uL (ref 0.0–0.5)
Eosinophils Relative: 1 %
HCT: 27 % — ABNORMAL LOW (ref 36.0–46.0)
Hemoglobin: 9.7 g/dL — ABNORMAL LOW (ref 12.0–15.0)
Immature Granulocytes: 1 %
Lymphocytes Relative: 20 %
Lymphs Abs: 1.9 10*3/uL (ref 0.7–4.0)
MCH: 36.7 pg — ABNORMAL HIGH (ref 26.0–34.0)
MCHC: 35.9 g/dL (ref 30.0–36.0)
MCV: 102.3 fL — ABNORMAL HIGH (ref 80.0–100.0)
Monocytes Absolute: 0.5 10*3/uL (ref 0.1–1.0)
Monocytes Relative: 6 %
Neutro Abs: 6.8 10*3/uL (ref 1.7–7.7)
Neutrophils Relative %: 72 %
Platelets: 353 10*3/uL (ref 150–400)
RBC: 2.64 MIL/uL — ABNORMAL LOW (ref 3.87–5.11)
RDW: 14.9 % (ref 11.5–15.5)
WBC: 9.4 10*3/uL (ref 4.0–10.5)
nRBC: 0 % (ref 0.0–0.2)

## 2020-05-14 LAB — COMPREHENSIVE METABOLIC PANEL
ALT: 10 U/L (ref 0–44)
AST: 20 U/L (ref 15–41)
Albumin: 3.1 g/dL — ABNORMAL LOW (ref 3.5–5.0)
Alkaline Phosphatase: 74 U/L (ref 38–126)
Anion gap: 19 — ABNORMAL HIGH (ref 5–15)
BUN: 177 mg/dL — ABNORMAL HIGH (ref 8–23)
CO2: 13 mmol/L — ABNORMAL LOW (ref 22–32)
Calcium: 9 mg/dL (ref 8.9–10.3)
Chloride: 107 mmol/L (ref 98–111)
Creatinine, Ser: 27.96 mg/dL — ABNORMAL HIGH (ref 0.44–1.00)
Glucose, Bld: 79 mg/dL (ref 70–99)
Potassium: 7.3 mmol/L (ref 3.5–5.1)
Sodium: 139 mmol/L (ref 135–145)
Total Bilirubin: 0.9 mg/dL (ref 0.3–1.2)
Total Protein: 8 g/dL (ref 6.5–8.1)

## 2020-05-14 LAB — SARS CORONAVIRUS 2 BY RT PCR (HOSPITAL ORDER, PERFORMED IN ~~LOC~~ HOSPITAL LAB): SARS Coronavirus 2: POSITIVE — AB

## 2020-05-14 LAB — LACTIC ACID, PLASMA
Lactic Acid, Venous: 1 mmol/L (ref 0.5–1.9)
Lactic Acid, Venous: 0.9 mmol/L (ref 0.5–1.9)

## 2020-05-14 LAB — C-REACTIVE PROTEIN: CRP: 11.2 mg/dL — ABNORMAL HIGH (ref <1.0)

## 2020-05-14 LAB — FERRITIN: Ferritin: 691 ng/mL — ABNORMAL HIGH (ref 11–307)

## 2020-05-14 LAB — LACTATE DEHYDROGENASE: LDH: 294 U/L — ABNORMAL HIGH (ref 98–192)

## 2020-05-14 LAB — FIBRINOGEN: Fibrinogen: >800 mg/dL — ABNORMAL HIGH (ref 210–475)

## 2020-05-14 LAB — CBG MONITORING, ED
Glucose-Capillary: 63 mg/dL — ABNORMAL LOW (ref 70–99)
Glucose-Capillary: 76 mg/dL (ref 70–99)

## 2020-05-14 LAB — POC SARS CORONAVIRUS 2 AG -  ED: SARS Coronavirus 2 Ag: NEGATIVE

## 2020-05-14 LAB — PROCALCITONIN: Procalcitonin: 1.41 ng/mL

## 2020-05-14 LAB — TRIGLYCERIDES: Triglycerides: 160 mg/dL — ABNORMAL HIGH (ref <150)

## 2020-05-14 LAB — D-DIMER, QUANTITATIVE: D-Dimer, Quant: 8.96 ug/mL-FEU — ABNORMAL HIGH (ref 0.00–0.50)

## 2020-05-14 LAB — PHOSPHORUS: Phosphorus: 8.5 mg/dL — ABNORMAL HIGH (ref 2.5–4.6)

## 2020-05-14 MED ORDER — PANTOPRAZOLE SODIUM 40 MG PO TBEC
40.0000 mg | DELAYED_RELEASE_TABLET | Freq: Every day | ORAL | Status: DC
  Administered 2020-05-15 – 2020-05-21 (×7): 40 mg via ORAL
  Filled 2020-05-14 (×7): qty 1

## 2020-05-14 MED ORDER — CHLORHEXIDINE GLUCONATE CLOTH 2 % EX PADS
6.0000 | MEDICATED_PAD | Freq: Every day | CUTANEOUS | Status: DC
  Administered 2020-05-17 – 2020-05-21 (×3): 6 via TOPICAL

## 2020-05-14 MED ORDER — SODIUM BICARBONATE 8.4 % IV SOLN: 50.0000 meq | Freq: Once | INTRAVENOUS | Status: AC

## 2020-05-14 MED ORDER — HYDRALAZINE HCL 20 MG/ML IJ SOLN
5.0000 mg | Freq: Four times a day (QID) | INTRAMUSCULAR | Status: DC | PRN
  Administered 2020-05-14: 5 mg via INTRAVENOUS
  Filled 2020-05-14: qty 1

## 2020-05-14 MED ORDER — CALCIUM GLUCONATE 10 % IV SOLN
1.0000 g | Freq: Once | INTRAVENOUS | Status: DC
  Filled 2020-05-14: qty 10

## 2020-05-14 MED ORDER — EXEMESTANE 25 MG PO TABS: 25.0000 mg | ORAL_TABLET | Freq: Every day | ORAL | Status: DC

## 2020-05-14 MED ORDER — PREDNISONE 20 MG PO TABS
50.0000 mg | ORAL_TABLET | Freq: Every day | ORAL | Status: DC
  Administered 2020-05-17 – 2020-05-20 (×4): 50 mg via ORAL
  Filled 2020-05-14 (×5): qty 1

## 2020-05-14 MED ORDER — SODIUM BICARBONATE 8.4 % IV SOLN
INTRAVENOUS | Status: AC
  Administered 2020-05-14: 50 meq via INTRAVENOUS
  Filled 2020-05-14: qty 50

## 2020-05-14 MED ORDER — SODIUM POLYSTYRENE SULFONATE 15 GM/60ML PO SUSP
60.0000 g | ORAL | Status: AC
  Administered 2020-05-14: 60 g via ORAL
  Filled 2020-05-14: qty 240

## 2020-05-14 MED ORDER — ALBUTEROL SULFATE HFA 108 (90 BASE) MCG/ACT IN AERS
2.0000 | INHALATION_SPRAY | Freq: Once | RESPIRATORY_TRACT | Status: AC
  Administered 2020-05-14: 2 via RESPIRATORY_TRACT
  Filled 2020-05-14: qty 6.7

## 2020-05-14 MED ORDER — SODIUM CHLORIDE 0.9 % IV SOLN: 100.0000 mg | INTRAVENOUS | Status: DC

## 2020-05-14 MED ORDER — SODIUM CHLORIDE 0.9 % IV SOLN: 100.0000 mL | INTRAVENOUS | Status: DC | PRN

## 2020-05-14 MED ORDER — ALTEPLASE 2 MG IJ SOLR
2.0000 mg | Freq: Once | INTRAMUSCULAR | Status: DC | PRN
  Filled 2020-05-14: qty 2

## 2020-05-14 MED ORDER — SODIUM CHLORIDE 0.9 % IV SOLN
100.0000 mg | Freq: Every day | INTRAVENOUS | Status: AC
  Administered 2020-05-15 – 2020-05-18 (×4): 100 mg via INTRAVENOUS
  Filled 2020-05-14 (×4): qty 20

## 2020-05-14 MED ORDER — SODIUM CHLORIDE 0.9 % IV SOLN: 100.0000 mg | Freq: Every day | INTRAVENOUS | Status: DC

## 2020-05-14 MED ORDER — AMLODIPINE BESYLATE 5 MG PO TABS
5.0000 mg | ORAL_TABLET | Freq: Every day | ORAL | Status: DC
  Administered 2020-05-15: 5 mg via ORAL
  Filled 2020-05-14: qty 1

## 2020-05-14 MED ORDER — METHYLPREDNISOLONE SODIUM SUCC 125 MG IJ SOLR
1.0000 mg/kg | Freq: Two times a day (BID) | INTRAMUSCULAR | Status: AC
  Administered 2020-05-14 – 2020-05-17 (×6): 75 mg via INTRAVENOUS
  Filled 2020-05-14 (×6): qty 2

## 2020-05-14 MED ORDER — REMDESIVIR 100 MG IV SOLR
100.0000 mg | INTRAVENOUS | Status: AC
  Administered 2020-05-14 (×2): 100 mg via INTRAVENOUS
  Filled 2020-05-14 (×2): qty 20

## 2020-05-14 MED ORDER — SODIUM ZIRCONIUM CYCLOSILICATE 5 G PO PACK
10.0000 g | PACK | Freq: Two times a day (BID) | ORAL | Status: DC
  Filled 2020-05-14: qty 2

## 2020-05-14 MED ORDER — HEPARIN SODIUM (PORCINE) 5000 UNIT/ML IJ SOLN
5000.0000 [IU] | Freq: Three times a day (TID) | INTRAMUSCULAR | Status: DC
  Administered 2020-05-14: 5000 [IU] via SUBCUTANEOUS
  Filled 2020-05-14: qty 1

## 2020-05-14 MED ORDER — CHLORHEXIDINE GLUCONATE CLOTH 2 % EX PADS: 6.0000 | MEDICATED_PAD | Freq: Every day | CUTANEOUS | Status: DC

## 2020-05-14 MED ORDER — DEXTROSE 50 % IV SOLN
1.0000 | Freq: Once | INTRAVENOUS | Status: AC
  Administered 2020-05-14: 50 mL via INTRAVENOUS
  Filled 2020-05-14: qty 50

## 2020-05-14 MED ORDER — INSULIN ASPART 100 UNIT/ML IV SOLN
5.0000 [IU] | Freq: Once | INTRAVENOUS | Status: AC
  Administered 2020-05-14: 5 [IU] via INTRAVENOUS

## 2020-05-14 MED ORDER — HEPARIN SODIUM (PORCINE) 1000 UNIT/ML DIALYSIS
1000.0000 [IU] | INTRAMUSCULAR | Status: DC | PRN
  Filled 2020-05-14 (×3): qty 1

## 2020-05-14 MED ORDER — CALCIUM GLUCONATE-NACL 1-0.675 GM/50ML-% IV SOLN
1.0000 g | Freq: Once | INTRAVENOUS | Status: AC
  Administered 2020-05-14: 1000 mg via INTRAVENOUS
  Filled 2020-05-14: qty 50

## 2020-05-14 MED ORDER — DEXTROSE 10 % IV SOLN: Freq: Once | INTRAVENOUS | Status: AC

## 2020-05-14 MED ORDER — INSULIN ASPART 100 UNIT/ML IV SOLN
10.0000 [IU] | Freq: Once | INTRAVENOUS | Status: AC
  Administered 2020-05-14: 10 [IU] via INTRAVENOUS

## 2020-05-14 MED ORDER — ISOSORBIDE MONONITRATE ER 60 MG PO TB24
60.0000 mg | ORAL_TABLET | Freq: Every day | ORAL | Status: DC
  Administered 2020-05-15 – 2020-05-20 (×6): 60 mg via ORAL
  Filled 2020-05-14 (×6): qty 1

## 2020-05-14 NOTE — H&P (Addendum)
TRH H&P   Patient Demographics:    Rose Freeman, is a 80 y.o. female  MRN: [id number]   DOB - 1940/07/23  Admit Date - 05/14/2020  Outpatient Primary MD for the patient is Neale Burly, MD  Referring MD/NP/PA: Dr Wallie Char  Patient coming from: home  Chief Complaint  Patient presents with  . Weakness      HPI:    Rose Freeman  is a 80 y.o. female, with past medical history of CKD stage V, anemia, unspecified, CHF unspecified, hypothyroidism, hypertension, CAD status post CABG, patient presents to ED secondary to cough and weakness, she is unvaccinated against Covid, she presents with cough for last 4 days, patient is very poor historian, it was obtained from daughter by phone, patient with stage V kidney disease, being planned for peritoneal dialysis, upon presentation to ED work-up significant for creatinine of 28, potassium of 7.3, BUN of 177, and she is COVID-19 positive, with chest x-ray for multifocal opacity, but no oxygen requirement, she has anemia at 9.7, afebrile, no leukocytosis, normal lactic acid and D-dimer elevated at 8.9, triage hospitalist consulted to admit.   Review of systems:    In addition to the HPI above, she is very poor historian, her review of system is not very reliable No Fever-chills, reports fatigue, generalized weakness No Headache, No changes with Vision or hearing, No problems swallowing food or Liquids, No Chest pain, reports cough and shortness of breath No Abdominal pain, No Nausea or Vommitting, Bowel movements are regular, No Blood in stool or Urine, No dysuria, No new skin rashes or bruises, No new joints pains-aches,  No new weakness, tingling, numbness in any extremity, No recent weight gain or loss, No polyuria, polydypsia or polyphagia, No significant Mental Stressors.  A full 10 point Review of Systems was done, except as  stated above, all other Review of Systems were negative.   With Past History of the following :    Past Medical History:  Diagnosis Date  . Cancer ([place])   . CHF (congestive heart failure) ([place])   . Chronic kidney disease   . Hypertension       Past Surgical History:  Procedure Laterality Date  . COLONOSCOPY    . CORONARY ARTERY BYPASS GRAFT        Social History:     Social History   Tobacco Use  . Smoking status: Former Research scientist (life sciences)  . Smokeless tobacco: Never Used  Substance Use Topics  . Alcohol use: Not Currently      Family History :    History reviewed. No pertinent family history.    Home Medications:   Prior to Admission medications   Medication Sig Start Date End Date Taking? Authorizing Provider  albuterol (VENTOLIN HFA) 108 (90 Base) MCG/ACT inhaler Inhale 2 puffs into the lungs every 4 (four) hours as needed for wheezing or shortness of breath.  Yes [provider]  cinacalcet (SENSIPAR) 30 MG tablet Take 30 mg by mouth daily. 05/03/20  Yes [provider]  exemestane (AROMASIN) 25 MG tablet Take 25 mg by mouth daily. 05/03/20  Yes [provider]  FEROSUL 325 (65 Fe) MG tablet Take 325 mg by mouth 2 (two) times daily. 05/03/20  Yes [provider]  furosemide (LASIX) 20 MG tablet Take 40 mg by mouth 2 (two) times daily. Take two (2) tablets twice daily   Yes [provider]  isosorbide mononitrate (IMDUR) 60 MG 24 hr tablet Take 60 mg by mouth daily.   Yes [provider]  mirtazapine (REMERON) 7.5 MG tablet Take 7.5 mg by mouth at bedtime. 05/05/20  Yes [provider]  NIFEdipine (PROCARDIA XL/NIFEDICAL-XL) 90 MG 24 hr tablet Take 90 mg by mouth daily.   Yes [provider]     Allergies:    No Known Allergies   Physical Exam:   Vitals  Blood pressure (!) 208/94, pulse (!) 57, temperature 97.8 F (36.6 C), temperature source Oral, resp. rate 20, height 5\' 3"  (1.6 m), weight 74.8  kg, SpO2 100 %.   1. General frail crudely female, laying in bed, edentulous, no apparent distress  2.  Diminished insight and cognition, she is awake alert oriented x2   3. No F.N deficits, ALL C.Nerves Intact, Strength 5/5 all 4 extremities, Sensation intact all 4 extremities, Plantars down going.  4. Ears and Eyes appear Normal, Conjunctivae clear, PERRLA. Moist Oral Mucosa.  5. Supple Neck, No JVD, No cervical lymphadenopathy appriciated, No Carotid Bruits.  6. Symmetrical Chest wall movement, Good air movement bilaterally, CTAB.  7. RRR, No Gallops, Rubs or Murmurs, No Parasternal Heave.  8. Positive Bowel Sounds, Abdomen Soft, No tenderness, No organomegaly appriciated,No rebound -guarding or rigidity.  9.  No Cyanosis, Normal Skin Turgor, No Skin Rash or Bruise.  10. Good muscle tone,  joints appear normal , no effusions, Normal ROM.  11. No Palpable Lymph Nodes in Neck or Axillae    Data Review:    CBC Recent Labs  Lab 05/14/20 1425  WBC 9.4  HGB 9.7*  HCT 27.0*  PLT 353  MCV 102.3*  MCH 36.7*  MCHC 35.9  RDW 14.9  LYMPHSABS 1.9  MONOABS 0.5  EOSABS 0.1  BASOSABS 0.0   ------------------------------------------------------------------------------------------------------------------  Chemistries  Recent Labs  Lab 05/14/20 1425  NA 139  K 7.3*  CL 107  CO2 13*  GLUCOSE 79  BUN 177*  CREATININE 27.96*  CALCIUM 9.0  AST 20  ALT 10  ALKPHOS 74  BILITOT 0.9   ------------------------------------------------------------------------------------------------------------------ estimated creatinine clearance is 1.6 mL/min (A) (by C-G formula based on SCr of 27.96 mg/dL (H)). ------------------------------------------------------------------------------------------------------------------ No results for input(s): TSH, T4TOTAL, T3FREE, THYROIDAB in the last 72 hours.  Invalid input(s): FREET3  Coagulation profile No results for input(s): INR, PROTIME  in the last 168 hours. ------------------------------------------------------------------------------------------------------------------- Recent Labs    05/14/20 1425  DDIMER 8.96*   -------------------------------------------------------------------------------------------------------------------  Cardiac Enzymes No results for input(s): CKMB, TROPONINI, MYOGLOBIN in the last 168 hours.  Invalid input(s): CK ------------------------------------------------------------------------------------------------------------------ No results found for: BNP   ---------------------------------------------------------------------------------------------------------------  Urinalysis No results found for: COLORURINE, APPEARANCEUR, LABSPEC, PHURINE, GLUCOSEU, HGBUR, BILIRUBINUR, KETONESUR, PROTEINUR, UROBILINOGEN, NITRITE, LEUKOCYTESUR  ----------------------------------------------------------------------------------------------------------------   Imaging Results:    DG Chest Port 1 View  Result Date: 05/14/2020 CLINICAL DATA:  Cough EXAM: PORTABLE CHEST 1 VIEW COMPARISON:  10/22/2019. FINDINGS: Mildly enlarged cardiac silhouette. Status post CABG and  median sternotomy. Aortic atherosclerosis. New peripheral predominant interstitial opacities. Left basilar atelectasis. No visible pleural effusions or pneumothorax. IMPRESSION: New peripheral predominant interstitial opacities which could represent atypical infection or interstitial edema. Left basilar atelectasis. Electronically Signed   By: Margaretha Sheffield MD   On: 05/14/2020 13:01    My personal review of EKG: Rhythm NSR, Rate  62 /min, QTc 494 Sinus rhythm Short PR interval RBBB and LAFB No acute changes Nonspecific ST and T wave abnormality No old tracing to compare  Assessment & Plan:    Active Problems:   Hyperkalemia   Accelerated hypertension   CKD (chronic kidney disease), stage V (HCC)   AKI (acute kidney injury) ([place])    COVID-19 virus infection  AKI in CKD stage V, with hyperkalemia  -She is followed by nephrology at [facility], was due to have a peritoneal dialysis catheter. -She is with worsening renal function, creatinine at 28, BUN at 177, and potassium of 7.3. -He received hyperkalemia protocol including Kayexalate, D50 and IV insulin, bicarb and calcium gluconate, with repeat level -Renal input greatly appreciated, will initiate dialysis, general surgery to perform temporary access catheter and she will be dialyzed very early in a.m -Further management per renal.  Anemia -Anemia of chronic kidney disease, no indication for transfusion currently, will defer to IV iron and Procrit per renal  CAD -S/p CABG, she denies any chest pain  Hypertension -Blood pressure is elevated, will resume home medication, add as needed hydralazine  COVID-19 pneumonia -She is unvaccinated -We will start on IV Solu-Medrol and Remdesivir. -She is on room air -Elevated D-dimers most likely in the setting of inflammation due to Covid and renal failure, I will obtain venous Doppler and continue to trend.Marland Kitchen  DVT Prophylaxis Heparin   AM Labs Ordered, also please review Full Orders  Family Communication: Admission, patients condition and plan of care including tests being ordered have been discussed with the patient and daughter by phone who indicate understanding and agree with the plan and Code Status.  Code Status Full  Likely DC to  home  Condition GUARDED    Consults called: renal , general surgery    Admission status: inpatient    Time spent in minutes : 60 minutes   Phillips Climes M.D on 05/14/2020 at 7:43 PM   [facility] - Office  [phone]

## 2020-05-14 NOTE — ED Provider Notes (Signed)
[facility] EMERGENCY DEPARTMENT Provider Note   CSN: [id number] Arrival date & time: 05/14/20  1139     History Chief Complaint  Patient presents with  . Weakness    Rose Freeman is a 80 y.o. female.  HPI    80 year old female comes in with chief complaint of weakness.  Patient has history of CHF, CKD, hypertension.  She comes in with chief complaint of cough, weakness.  I called patient's family.  She likely is not vaccinated.  They report that over the last 4 days or so her cough has progressed and is severe.  Patient is also having weakness.  Patient admits to some shortness of breath and cough.  She thinks she might have some wheezing.  She denies any fevers, chills.  Past Medical History:  Diagnosis Date  . Cancer ([place])   . CHF (congestive heart failure) ([place])   . Chronic kidney disease   . Hypertension     There are no problems to display for this patient.   Past Surgical History:  Procedure Laterality Date  . COLONOSCOPY    . CORONARY ARTERY BYPASS GRAFT       OB History   No obstetric history on file.     History reviewed. No pertinent family history.  Social History   Tobacco Use  . Smoking status: Former Research scientist (life sciences)  . Smokeless tobacco: Never Used  Substance Use Topics  . Alcohol use: Not Currently  . Drug use: Never    Home Medications Prior to Admission medications   Medication Sig Start Date End Date Taking? Authorizing Provider  albuterol (VENTOLIN HFA) 108 (90 Base) MCG/ACT inhaler Inhale 2 puffs into the lungs every 4 (four) hours as needed for wheezing or shortness of breath.    [provider]  aspirin EC 81 MG tablet Take 81 mg by mouth daily.    [provider]  calcium acetate (PHOSLO) 667 MG capsule Take by mouth 2 (two) times daily.    [provider]  donepezil (ARICEPT) 10 MG tablet Take 10 mg by mouth at bedtime.    [provider]  furosemide (LASIX) 20 MG tablet Take 40 mg by mouth 2  (two) times daily. Take two (2) tablets twice daily    [provider]  HYDRALAZINE HCL PO Take 50 mg by mouth 3 (three) times daily.    [provider]  isosorbide mononitrate (IMDUR) 60 MG 24 hr tablet Take 60 mg by mouth daily.    [provider]  labetalol (NORMODYNE) 300 MG tablet Take 300 mg by mouth 2 (two) times daily. Take two (2) tablets twice daily    [provider]  levothyroxine (SYNTHROID) 100 MCG tablet Take 100 mcg by mouth daily before breakfast.    [provider]  NIFEdipine (PROCARDIA XL/NIFEDICAL-XL) 90 MG 24 hr tablet Take 90 mg by mouth daily.    [provider]  pravastatin (PRAVACHOL) 20 MG tablet Take 20 mg by mouth daily.    [provider]  sodium bicarbonate 650 MG tablet Take 650 mg by mouth daily.    [provider]    Allergies    Patient has no known allergies.  Review of Systems   Review of Systems  Constitutional: Positive for activity change and fatigue. Negative for fever.  Respiratory: Positive for cough, shortness of breath and wheezing.   Cardiovascular: Negative for chest pain.  Gastrointestinal: Negative for nausea and vomiting.  Allergic/Immunologic: Negative for immunocompromised state.  Hematological:  Does not bruise/bleed easily.  All other systems reviewed and are negative.   Physical Exam Updated Vital Signs BP (!) 199/73   Pulse (!) 50   Temp 97.8 F (36.6 C) (Oral)   Resp 15   Ht 5\' 3"  (1.6 m)   Wt 74.8 kg   SpO2 98%   BMI 29.23 kg/m   Physical Exam Vitals and nursing note reviewed.  Constitutional:      Appearance: She is well-developed and well-nourished.  HENT:     Head: Normocephalic and atraumatic.  Eyes:     Extraocular Movements: EOM normal.     Pupils: Pupils are equal, round, and reactive to light.  Cardiovascular:     Rate and Rhythm: Normal rate and regular rhythm.     Heart sounds: Normal heart sounds. No murmur heard.   Pulmonary:      Effort: Pulmonary effort is normal.     Breath sounds: Wheezing and rales present.  Abdominal:     General: There is no distension.     Palpations: Abdomen is soft.     Tenderness: There is no abdominal tenderness. There is no guarding or rebound.  Musculoskeletal:     Cervical back: Neck supple.     Right lower leg: No edema.     Left lower leg: No edema.  Skin:    General: Skin is warm and dry.  Neurological:     Mental Status: She is alert and oriented to person, place, and time.     ED Results / Procedures / Treatments   Labs (all labs ordered are listed, but only abnormal results are displayed) Labs Reviewed  SARS CORONAVIRUS 2 BY RT PCR ([place] [place] [facility]) - Abnormal; Notable for the following components:      Result Value   SARS Coronavirus 2 POSITIVE (*)    All other components within normal limits  CBC WITH DIFFERENTIAL/PLATELET - Abnormal; Notable for the following components:   RBC 2.64 (*)    Hemoglobin 9.7 (*)    HCT 27.0 (*)    MCV 102.3 (*)    MCH 36.7 (*)    Abs Immature Granulocytes 0.09 (*)    All other components within normal limits  COMPREHENSIVE METABOLIC PANEL - Abnormal; Notable for the following components:   Potassium 7.3 (*)    CO2 13 (*)    BUN 177 (*)    Creatinine, Ser 27.96 (*)    Albumin 3.1 (*)    Anion gap 19 (*)    All other components within normal limits  D-DIMER, QUANTITATIVE (NOT AT [facility]) - Abnormal; Notable for the following components:   D-Dimer, Quant 8.96 (*)    All other components within normal limits  LACTATE DEHYDROGENASE - Abnormal; Notable for the following components:   LDH 294 (*)    All other components within normal limits  FERRITIN - Abnormal; Notable for the following components:   Ferritin 691 (*)    All other components within normal limits  TRIGLYCERIDES - Abnormal; Notable for the following components:   Triglycerides 160 (*)    All other components within normal  limits  FIBRINOGEN - Abnormal; Notable for the following components:   Fibrinogen >800 (*)    All other components within normal limits  C-REACTIVE PROTEIN - Abnormal; Notable for the following components:   CRP 11.2 (*)    All other components within normal limits  CULTURE, BLOOD (ROUTINE X 2)  CULTURE, BLOOD (ROUTINE X 2)  LACTIC ACID, PLASMA  PROCALCITONIN  LACTIC ACID, PLASMA  POC SARS CORONAVIRUS 2 AG -  ED    EKG EKG Interpretation  Date/Time:  Wednesday May 14 2020 11:57:34 EST Ventricular Rate:  62 PR Interval:    QRS Duration: 166 QT Interval:  486 QTC Calculation: 494 R Axis:   -94 Text Interpretation: Sinus rhythm Short PR interval RBBB and LAFB No acute changes Nonspecific ST and T wave abnormality No old tracing to compare Confirmed by Varney Biles [phone]) on 05/14/2020 12:26:27 PM   Radiology DG Chest Port 1 View  Result Date: 05/14/2020 CLINICAL DATA:  Cough EXAM: PORTABLE CHEST 1 VIEW COMPARISON:  10/22/2019. FINDINGS: Mildly enlarged cardiac silhouette. Status post CABG and median sternotomy. Aortic atherosclerosis. New peripheral predominant interstitial opacities. Left basilar atelectasis. No visible pleural effusions or pneumothorax. IMPRESSION: New peripheral predominant interstitial opacities which could represent atypical infection or interstitial edema. Left basilar atelectasis. Electronically Signed   By: Margaretha Sheffield MD   On: 05/14/2020 13:01    Procedures .Critical Care Performed by: Varney Biles, MD Authorized by: Varney Biles, MD   Critical care provider statement:    Critical care time (minutes):  62   Critical care was necessary to treat or prevent imminent or life-threatening deterioration of the following conditions:  Metabolic crisis, circulatory failure and renal failure   Critical care was time spent personally by me on the following activities:  Discussions with consultants, evaluation of patient's response to treatment,  examination of patient, ordering and performing treatments and interventions, ordering and review of laboratory studies, ordering and review of radiographic studies, pulse oximetry, re-evaluation of patient's condition, obtaining history from patient or surrogate and review of old charts     Medications Ordered in ED Medications  calcium gluconate inj 10% (1 g) URGENT USE ONLY! (has no administration in time range)  insulin aspart (novoLOG) injection 5 Units (has no administration in time range)    And  dextrose 50 % solution 50 mL (has no administration in time range)  sodium bicarbonate injection 50 mEq (has no administration in time range)  dextrose 10 % infusion (has no administration in time range)  albuterol (VENTOLIN HFA) 108 (90 Base) MCG/ACT inhaler 2 puff (2 puffs Inhalation Given 05/14/20 1422)    ED Course  I have reviewed the triage vital signs and the nursing notes.  Pertinent labs & imaging results that were available during my care of the patient were reviewed by me and considered in my medical decision making (see chart for details).    MDM Rules/Calculators/A&P                          80 year old female with history of CHF, CKD comes in a chief complaint of cough and weakness.  She does not appear profoundly dehydrated.  Initial thought is that patient likely has COVID-19 because of the worsening cough and weakness.  She is also not vaccinated.  Her work-up confirms that she has COVID-19.  In addition she is noted to have florid renal failure with BUN 177, K7.3.  Patient's EKG had shown some intraventricular delay, but we do not have an old EKG to compare with.  She has received acute severe hyperkalemia medications.  Nephrology has been consulted.  Patient will need admission to the hospital.  Final Clinical Impression(s) / ED Diagnoses Final diagnoses:  COVID-19  Acute renal failure superimposed on stage 5 chronic kidney disease, not on chronic dialysis,  unspecified  acute renal failure type ([place])  Acute hyperkalemia    Rx / DC Orders ED Discharge Orders    None       Varney Biles, MD 05/14/20 1556

## 2020-05-14 NOTE — ED Triage Notes (Signed)
Pt to er, pt states that she is here because she is feeling weak for the past week, pt has wet cough, pt has some accessory muscle use and reports shortness of breath.

## 2020-05-14 NOTE — Procedures (Signed)
   HEMODIALYSIS TREATMENT NOTE (HD#1):   Indications / risks / benefits of RRT were discussed with patient's daughter, Rose Freeman, prior to obtaining telephone consent for first ever hemodialysis session on 05/14/20.  Appreciate prompt response of Dr. Constance Haw in placing a right femoral temporary catheter for emergent HD.  2 hour session completed; low-K bath for first hour. Catheter able to tolerate Qb 250 but with AP at near max limit.  Line reversal failed.  Please arrange [facility] placement as soon as possible.  Goal met: 1 liter removed.  No interruption in UF.  All blood was returned.  No changes from pre-HD assessment.   Rockwell Alexandria, RN

## 2020-05-14 NOTE — Consult Note (Signed)
Reason for Consult: Renal failure Referring Physician:  Dr. Kathrynn Humble  Chief Complaint: Weakness  Assessment/Plan: 1. 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 Lindalou Hose and she was due to have a PD catheter placed and dialysis initiated. Unfortunately her renal function has worsened with her acute illness and she will need a catheter to be placed to initiate RRT. - Prefer a TC but a femoral vasc cath is fine also; we want to initiate dialysis as soon as we can in the AM. I've already spoken with Levada Dy and she will coordinate to have the catheter placed to initiate dialysis. May be reasonable to just place a femoral catheter and dialysis for a few days prior to placement of a tunneled catheter given the hyperkalemia. - Medical management for the hyperkalemia and will follow up on labs. - Lokelma 10g BID as well. 2. Anemia - transfuse as needed; will initiate ESA therapy as indicated. No IV iron given acute infection. 3. Renal osteodystrophy - will check a phos and iPTH for management of binders. 4. CASHD - h/o CABG 5. HTN - restart home meds; UF on RRT will help as well 6. Dementia   HPI: Rose Freeman is an 80 y.o. female CHF HTN CKD Hypothyroidism HLD CASHD s/p CABG presenting with cough and weakness. She is unvaccinated and over the past 4 days her cough has gotten worse with associated shortness of breath. She denies fevers or chills; she is unvaccinated. She was found to be hyperkalemic with renal failure with BUN/ Cr of 177/28 with a potassium of  7.3 treated with protocol. Her CRP was 11.2, DDimer of 8.96 and fibrinogen >800.  CXR showed new peripheral predominant interstitial opacities c/w  atypical infection vs interstitial edema and left basilar atelectasis. She has had a cough productive of yellow sputum for about a week now but denies myalgias. She follows with Dr. Lindalou Hose (renal) in [facility] and she lives in [place]; she's down here because there are no  hospital beds available close to where she resides. Her daughter Cori Razor [phone]) helps to take care of her. She denies any NSAID use or obstructive symptoms or dysuria or hematuria. According to [facility] she is nearing dialysis and plans were to place a PD catheter but unfortunately she became sick prior to having a catheter placed.  ROS Pertinent items are noted in HPI.  Chemistry and CBC: Creatinine, Ser  Date/Time Value Ref Range Status  05/14/2020 02:25 PM 27.96 (H) 0.44 - 1.00 mg/dL Final    Comment:    RESULTS CONFIRMED BY MANUAL DILUTION   Recent Labs  Lab 05/14/20 1425  NA 139  K 7.3*  CL 107  CO2 13*  GLUCOSE 79  BUN 177*  CREATININE 27.96*  CALCIUM 9.0   Recent Labs  Lab 05/14/20 1425  WBC 9.4  NEUTROABS 6.8  HGB 9.7*  HCT 27.0*  MCV 102.3*  PLT 353   Liver Function Tests: Recent Labs  Lab 05/14/20 1425  AST 20  ALT 10  ALKPHOS 74  BILITOT 0.9  PROT 8.0  ALBUMIN 3.1*   No results for input(s): LIPASE, AMYLASE in the last 168 hours. No results for input(s): AMMONIA in the last 168 hours. Cardiac Enzymes: No results for input(s): CKTOTAL, CKMB, CKMBINDEX, TROPONINI in the last 168 hours. Iron Studies:  Recent Labs    05/14/20 1427  FERRITIN 691*   PT/INR: @LABRCNTIP (inr:5)  Xrays/Other Studies: ) Results for orders placed or performed during the hospital encounter of  05/14/20 (from the past 48 hour(s))  POC SARS Coronavirus 2 Ag-ED - Nasal Swab (BD Veritor Kit)     Status: None   Collection Time: 05/14/20 12:51 PM  Result Value Ref Range   SARS Coronavirus 2 Ag NEGATIVE NEGATIVE    Comment: (NOTE) SARS-CoV-2 antigen NOT DETECTED.   Negative results are presumptive.  Negative results do not preclude SARS-CoV-2 infection and should not be used as the sole basis for treatment or other patient management decisions, including infection  control decisions, particularly in the presence of clinical signs and  symptoms consistent with COVID-19,  or in those who have been in contact with the virus.  Negative results must be combined with clinical observations, patient history, and epidemiological information. The expected result is Negative.  Fact Sheet for Patients: HandmadeRecipes.com.cy  Fact Sheet for Healthcare Providers: FuneralLife.at  This test is not yet approved or cleared by the [place] FDA and  has been authorized for detection and/or diagnosis of SARS-CoV-2 by FDA under an Emergency Use Authorization (EUA).  This EUA will remain in effect (meaning this test can be used) for the duration of  the COV ID-19 declaration under Section 564(b)(1) of the Act, 21 U.S.C. section 360bbb-3(b)(1), unless the authorization is terminated or revoked sooner.    Blood Culture (routine x 2)     Status: None (Preliminary result)   Collection Time: 05/14/20  2:23 PM   Specimen: BLOOD  Result Value Ref Range   Specimen Description BLOOD LEFT WRIST    Special Requests      BOTTLES DRAWN AEROBIC AND ANAEROBIC Blood Culture adequate volume Performed at [facility], [address] [place], [place] [address]    Culture PENDING    Report Status PENDING   Blood Culture (routine x 2)     Status: None (Preliminary result)   Collection Time: 05/14/20  2:24 PM   Specimen: BLOOD  Result Value Ref Range   Specimen Description BLOOD LEFT HAND    Special Requests      BOTTLES DRAWN AEROBIC AND ANAEROBIC Blood Culture adequate volume Performed at [facility], [address], [place], [place] [address]    Culture PENDING    Report Status PENDING   Lactic acid, plasma     Status: None   Collection Time: 05/14/20  2:25 PM  Result Value Ref Range   Lactic Acid, Venous 1.0 0.5 - 1.9 mmol/L    Comment: Performed at [facility], [address] [place], [place] [address]  CBC WITH DIFFERENTIAL     Status: Abnormal   Collection Time: 05/14/20  2:25 PM  Result Value Ref Range   WBC 9.4 4.0 -  10.5 K/uL   RBC 2.64 (L) 3.87 - 5.11 MIL/uL   Hemoglobin 9.7 (L) 12.0 - 15.0 g/dL   HCT 27.0 (L) 36.0 - 46.0 %   MCV 102.3 (H) 80.0 - 100.0 fL   MCH 36.7 (H) 26.0 - 34.0 pg   MCHC 35.9 30.0 - 36.0 g/dL   RDW 14.9 11.5 - 15.5 %   Platelets 353 150 - 400 K/uL   nRBC 0.0 0.0 - 0.2 %   Neutrophils Relative % 72 %   Neutro Abs 6.8 1.7 - 7.7 K/uL   Lymphocytes Relative 20 %   Lymphs Abs 1.9 0.7 - 4.0 K/uL   Monocytes Relative 6 %   Monocytes Absolute 0.5 0.1 - 1.0 K/uL   Eosinophils Relative 1 %   Eosinophils Absolute 0.1 0.0 - 0.5 K/uL  Basophils Relative 0 %   Basophils Absolute 0.0 0.0 - 0.1 K/uL   Immature Granulocytes 1 %   Abs Immature Granulocytes 0.09 (H) 0.00 - 0.07 K/uL    Comment: Performed at [facility], [address] [place], [place] [address]  Comprehensive metabolic panel     Status: Abnormal   Collection Time: 05/14/20  2:25 PM  Result Value Ref Range   Sodium 139 135 - 145 mmol/L   Potassium 7.3 (HH) 3.5 - 5.1 mmol/L    Comment: CRITICAL RESULT CALLED TO, READ BACK BY AND VERIFIED WITH: HOLCOMB,R ON 05/14/20 AT 1455 BY LOY,C    Chloride 107 98 - 111 mmol/L   CO2 13 (L) 22 - 32 mmol/L   Glucose, Bld 79 70 - 99 mg/dL    Comment: Glucose reference range applies only to samples taken after fasting for at least 8 hours.   BUN 177 (H) 8 - 23 mg/dL    Comment: RESULTS CONFIRMED BY MANUAL DILUTION   Creatinine, Ser 27.96 (H) 0.44 - 1.00 mg/dL    Comment: RESULTS CONFIRMED BY MANUAL DILUTION   Calcium 9.0 8.9 - 10.3 mg/dL   Total Protein 8.0 6.5 - 8.1 g/dL   Albumin 3.1 (L) 3.5 - 5.0 g/dL   AST 20 15 - 41 U/L   ALT 10 0 - 44 U/L   Alkaline Phosphatase 74 38 - 126 U/L   Total Bilirubin 0.9 0.3 - 1.2 mg/dL   GFR, Estimated NOT CALCULATED >60 mL/min    Comment: (NOTE) Calculated using the CKD-EPI Creatinine Equation (2021)    Anion gap 19 (H) 5 - 15    Comment: Performed at [facility], [address], [place], [place] [address]  D-dimer, quantitative     Status:  Abnormal   Collection Time: 05/14/20  2:25 PM  Result Value Ref Range   D-Dimer, Quant 8.96 (H) 0.00 - 0.50 ug/mL-FEU    Comment: (NOTE) At the manufacturer cut-off value of 0.5 g/mL FEU, this assay has a negative predictive value of 95-100%.This assay is intended for use in conjunction with a clinical pretest probability (PTP) assessment model to exclude pulmonary embolism (PE) and deep venous thrombosis (DVT) in outpatients suspected of PE or DVT. Results should be correlated with clinical presentation. Performed at [facility], [address], [place], [place] [address]   Procalcitonin     Status: None   Collection Time: 05/14/20  2:25 PM  Result Value Ref Range   Procalcitonin 1.41 ng/mL    Comment:        Interpretation: PCT > 0.5 ng/mL and <= 2 ng/mL: Systemic infection (sepsis) is possible, but other conditions are known to elevate PCT as well. (NOTE)       Sepsis PCT Algorithm           Lower Respiratory Tract                                      Infection PCT Algorithm    ----------------------------     ----------------------------         PCT < 0.25 ng/mL                PCT < 0.10 ng/mL          Strongly encourage             Strongly discourage   discontinuation of antibiotics    initiation of  antibiotics    ----------------------------     -----------------------------       PCT 0.25 - 0.50 ng/mL            PCT 0.10 - 0.25 ng/mL               OR       >80% decrease in PCT            Discourage initiation of                                            antibiotics      Encourage discontinuation           of antibiotics    ----------------------------     -----------------------------         PCT >= 0.50 ng/mL              PCT 0.26 - 0.50 ng/mL                AND       <80% decrease in PCT             Encourage initiation of                                             antibiotics       Encourage continuation           of antibiotics     ----------------------------     -----------------------------        PCT >= 0.50 ng/mL                  PCT > 0.50 ng/mL               AND         increase in PCT                  Strongly encourage                                      initiation of antibiotics    Strongly encourage escalation           of antibiotics                                     -----------------------------                                           PCT <= 0.25 ng/mL                                                 OR                                        >  80% decrease in PCT                                      Discontinue / Do not initiate                                             antibiotics  Performed at [facility], [address], [place], [place] [address]   Lactate dehydrogenase     Status: Abnormal   Collection Time: 05/14/20  2:25 PM  Result Value Ref Range   LDH 294 (H) 98 - 192 U/L    Comment: Performed at [facility], [address] [place], [place] [address]  Fibrinogen     Status: Abnormal   Collection Time: 05/14/20  2:25 PM  Result Value Ref Range   Fibrinogen >800 (H) 210 - 475 mg/dL    Comment: Performed at [facility], [address] [place], [place] [address]  Triglycerides     Status: Abnormal   Collection Time: 05/14/20  2:26 PM  Result Value Ref Range   Triglycerides 160 (H) <150 mg/dL    Comment: Performed at [facility], [address], [place], [place] [address]  Ferritin     Status: Abnormal   Collection Time: 05/14/20  2:27 PM  Result Value Ref Range   Ferritin 691 (H) 11 - 307 ng/mL    Comment: Performed at [facility], [address], [place], [place] [address]  C-reactive protein     Status: Abnormal   Collection Time: 05/14/20  2:27 PM  Result Value Ref Range   CRP 11.2 (H) <1.0 mg/dL    Comment: Performed at [facility], [address] [place], [place] [address]  SARS Coronavirus 2 by RT PCR (hospital order, performed in [facility] hospital lab)  Nasopharyngeal Nasopharyngeal Swab     Status: Abnormal   Collection Time: 05/14/20  2:28 PM   Specimen: Nasopharyngeal Swab  Result Value Ref Range   SARS Coronavirus 2 POSITIVE (A) NEGATIVE    Comment: RESULT CALLED TO, READ BACK BY AND VERIFIED WITH: CRAWFORD,H RN @1540  05/14/20 BY JONES,T (NOTE) SARS-CoV-2 target nucleic acids are DETECTED  SARS-CoV-2 RNA is generally detectable in upper respiratory specimens  during the acute phase of infection.  Positive results are indicative  of the presence of the identified virus, but do not rule out bacterial infection or co-infection with other pathogens not detected by the test.  Clinical correlation with patient history and  other diagnostic information is necessary to determine patient infection status.  The expected result is negative.  Fact Sheet for Patients:   StrictlyIdeas.no   Fact Sheet for Healthcare Providers:   BankingDealers.co.za    This test is not yet approved or cleared by the [place] FDA and  has been authorized for detection and/or diagnosis of SARS-CoV-2 by FDA under an Emergency Use Authorization (EUA).  This EUA will remain in effect (meaning this  test can be used) for the duration of  the COVID-19 declaration under Section 564(b)(1) of the Act, 21 U.S.C. section 360-bbb-3(b)(1), unless the authorization is terminated or revoked sooner.  Performed at [facility], [address], [place], [place] [address]   Lactic acid, plasma     Status: None   Collection Time: 05/14/20  4:20 PM  Result Value Ref Range   Lactic Acid, Venous 0.9 0.5 - 1.9 mmol/L    Comment: Performed at [facility], [address], [place], [place] [address]  CBG monitoring, ED     Status: Abnormal   Collection Time: 05/14/20  4:52 PM  Result Value Ref Range   Glucose-Capillary 63 (L) 70 - 99 mg/dL    Comment: Glucose reference range applies only to samples taken after fasting for at least  8 hours.   DG Chest Port 1 View  Result Date: 05/14/2020 CLINICAL DATA:  Cough EXAM: PORTABLE CHEST 1 VIEW COMPARISON:  10/22/2019. FINDINGS: Mildly enlarged cardiac silhouette. Status post CABG and median sternotomy. Aortic atherosclerosis. New peripheral predominant interstitial opacities. Left basilar atelectasis. No visible pleural effusions or pneumothorax. IMPRESSION: New peripheral predominant interstitial opacities which could represent atypical infection or interstitial edema. Left basilar atelectasis. Electronically Signed   By: Margaretha Sheffield MD   On: 05/14/2020 13:01    PMH:   Past Medical History:  Diagnosis Date  . Cancer ([place])   . CHF (congestive heart failure) ([place])   . Chronic kidney disease   . Hypertension     PSH:   Past Surgical History:  Procedure Laterality Date  . COLONOSCOPY    . CORONARY ARTERY BYPASS GRAFT      Allergies: No Known Allergies  Medications:   Prior to Admission medications   Medication Sig Start Date End Date Taking? Authorizing Provider  albuterol (VENTOLIN HFA) 108 (90 Base) MCG/ACT inhaler Inhale 2 puffs into the lungs every 4 (four) hours as needed for wheezing or shortness of breath.   Yes [provider]  cinacalcet (SENSIPAR) 30 MG tablet Take 30 mg by mouth daily. 05/03/20  Yes [provider]  exemestane (AROMASIN) 25 MG tablet Take 25 mg by mouth daily. 05/03/20  Yes [provider]  FEROSUL 325 (65 Fe) MG tablet Take 325 mg by mouth 2 (two) times daily. 05/03/20  Yes [provider]  furosemide (LASIX) 20 MG tablet Take 40 mg by mouth 2 (two) times daily. Take two (2) tablets twice daily   Yes [provider]  isosorbide mononitrate (IMDUR) 60 MG 24 hr tablet Take 60 mg by mouth daily.   Yes [provider]  mirtazapine (REMERON) 7.5 MG tablet Take 7.5 mg by mouth at bedtime. 05/05/20  Yes [provider]  NIFEdipine (PROCARDIA XL/NIFEDICAL-XL) 90 MG 24 hr tablet Take 90  mg by mouth daily.   Yes [provider]    Discontinued Meds:   Medications Discontinued During This Encounter  Medication Reason  . aspirin EC 81 MG tablet Patient Preference  . HYDRALAZINE HCL PO Patient Preference  . levothyroxine (SYNTHROID) 100 MCG tablet Patient Preference  . labetalol (NORMODYNE) 300 MG tablet Patient Preference  . pravastatin (PRAVACHOL) 20 MG tablet Patient Preference  . sodium bicarbonate 650 MG tablet Patient Preference  . donepezil (ARICEPT) 10 MG tablet Patient Preference  . calcium acetate (PHOSLO) 667 MG capsule Patient Preference  . calcium gluconate inj 10% (1 g) URGENT USE ONLY!     Social History:  reports that she has quit smoking. She has never used smokeless tobacco. She reports previous alcohol use. She reports that she does not use drugs.  Family History:  History reviewed. No pertinent family history.  Blood pressure (!) 177/116, pulse (!) 51, temperature 97.8 F (36.6 C), temperature source Oral, resp. rate 18, height 5' 3"  (1.6 m), weight 74.8 kg, SpO2 99 %. General appearance:  alert, cooperative and appears stated age Head: Normocephalic, without obvious abnormality, atraumatic Eyes: conj pallor Neck: no adenopathy, no carotid bruit, supple, symmetrical, trachea midline and thyroid not enlarged, symmetric, no tenderness/mass/nodules Back: symmetric, no curvature. ROM normal. No CVA tenderness. Resp: rhonchi bibasilar and bilaterally Chest wall: no tenderness Cardio: bradycardic with HR in the 50's GI: soft, non-tender; bowel sounds normal; no masses,  no organomegaly Extremities: edema trace Pulses: 2+ and symmetric Skin: Skin color, texture, turgor normal. No rashes or lesions Lymph nodes: Cervical adenopathy: +       Dwana Melena, MD 05/14/2020, 6:24 PM

## 2020-05-14 NOTE — ED Notes (Signed)
Black Mesh Bag with Pt belongings bought back by screener and placed in the Pt room and labeled.

## 2020-05-14 NOTE — Procedures (Signed)
Procedure Note  05/14/20   Preoperative Diagnosis: Poor access, End stage renal disease, hyperkalemia and COVID    Postoperative Diagnosis: Same   Procedure(s) Performed: Central Line placement, left femoral triple lumen 7.5 french catheter; Dialysis catheter placement, right femoral double lumen 12 french catheter     Surgeon: Lanell Matar. Constance Haw, MD   Assistants: None   Anesthesia: 1% lidocaine    Complications: None    Indications: Ms. Lanni is a 80 y.o. with hyperkalemia, ESRD, poor access and COVID . I discussed the risk and benefits of placement of the central line with her and her daughter Cori Razor, including but not limited to bleeding, infection, and risk of injury to vessels. Mabel has given Telephone consent for the procedures including dialysis access and central venous access.    Procedure: The patient placed supine. The left and right groins were prepped and draped in the usual sterile fashion.  Wearing full gown and gloves, I performed the procedure.  One percent lidocaine was used for local anesthesia. An ultrasound was utilized to assess the right femoral vein.  The needle with syringe was advanced into the vein with dark venous return, and a wire was placed using the Seldinger technique without difficulty.  The skin was knicked and a dilator was placed, and the double lumen dialysis catheter was placed over the wire with continued control of the wire.  There was good draw back of blood from all lumens and each flushed easily with saline.  The catheter was secured in 2 points with 2-0 silk and a biopatch and dressing was placed.     I then proceeded to place the central venous line in the right femoral vein. One percent lidocaine was used for local anesthesia. An ultrasound was utilized to assess the vein.  The needle with syringe was advanced into the vein with dark venous return, and a wire was placed using the Seldinger technique without difficulty.  The skin was knicked and a  dilator was placed, and the three lumen catheter was placed over the wire with continued control of the wire.  There was good draw back of blood from all three lumens and each flushed easily with saline.  The catheter was secured in 2 points with 2-0 silk and a biopatch and dressing was placed.     The patient tolerated the procedure well. No hematomas were noted.   Curlene Labrum, MD [facility] [address] [place], [place] [address] [phone] (office)

## 2020-05-14 NOTE — ED Provider Notes (Signed)
Patient requiring admission for Covid as well as acute kidney failure.  And significant hyperkalemia.  Have ordered repeat BMP.  Discussed with Dr. Osborne Casco from nephrology.  Aware.  Discussed with hospitalist.  However nephrology did not give a specific game plan.  Ever the hospitalist will recontact them and work out the appropriate place for admission.  Dialysis can be done here but patient does not have vascular access.   Fredia Sorrow, MD 05/14/20 [phone]

## 2020-05-15 ENCOUNTER — Inpatient Hospital Stay (HOSPITAL_COMMUNITY): Payer: Medicare Other

## 2020-05-15 DIAGNOSIS — D631 Anemia in chronic kidney disease: Secondary | ICD-10-CM

## 2020-05-15 LAB — COMPREHENSIVE METABOLIC PANEL
ALT: 9 U/L (ref 0–44)
AST: 18 U/L (ref 15–41)
Albumin: 2.6 g/dL — ABNORMAL LOW (ref 3.5–5.0)
Alkaline Phosphatase: 69 U/L (ref 38–126)
Anion gap: 17 — ABNORMAL HIGH (ref 5–15)
BUN: 117 mg/dL — ABNORMAL HIGH (ref 8–23)
CO2: 20 mmol/L — ABNORMAL LOW (ref 22–32)
Calcium: 8.6 mg/dL — ABNORMAL LOW (ref 8.9–10.3)
Chloride: 101 mmol/L (ref 98–111)
Creatinine, Ser: 20.04 mg/dL — ABNORMAL HIGH (ref 0.44–1.00)
GFR, Estimated: 2 mL/min — ABNORMAL LOW (ref >60)
Glucose, Bld: 166 mg/dL — ABNORMAL HIGH (ref 70–99)
Potassium: 4.7 mmol/L (ref 3.5–5.1)
Sodium: 138 mmol/L (ref 135–145)
Total Bilirubin: 0.7 mg/dL (ref 0.3–1.2)
Total Protein: 7.1 g/dL (ref 6.5–8.1)

## 2020-05-15 LAB — CBG MONITORING, ED
Glucose-Capillary: 196 mg/dL — ABNORMAL HIGH (ref 70–99)
Glucose-Capillary: 143 mg/dL — ABNORMAL HIGH (ref 70–99)
Glucose-Capillary: 142 mg/dL — ABNORMAL HIGH (ref 70–99)

## 2020-05-15 LAB — D-DIMER, QUANTITATIVE: D-Dimer, Quant: 10.56 ug/mL-FEU — ABNORMAL HIGH (ref 0.00–0.50)

## 2020-05-15 LAB — MRSA PCR SCREENING: MRSA by PCR: NEGATIVE

## 2020-05-15 LAB — CBC WITH DIFFERENTIAL/PLATELET
Abs Immature Granulocytes: 0.06 10*3/uL (ref 0.00–0.07)
Basophils Absolute: 0 10*3/uL (ref 0.0–0.1)
Basophils Relative: 0 %
Eosinophils Absolute: 0 10*3/uL (ref 0.0–0.5)
Eosinophils Relative: 0 %
HCT: 27.3 % — ABNORMAL LOW (ref 36.0–46.0)
Hemoglobin: 9.7 g/dL — ABNORMAL LOW (ref 12.0–15.0)
Immature Granulocytes: 1 %
Lymphocytes Relative: 9 %
Lymphs Abs: 0.5 10*3/uL — ABNORMAL LOW (ref 0.7–4.0)
MCH: 34.9 pg — ABNORMAL HIGH (ref 26.0–34.0)
MCHC: 35.5 g/dL (ref 30.0–36.0)
MCV: 98.2 fL (ref 80.0–100.0)
Monocytes Absolute: 0.1 10*3/uL (ref 0.1–1.0)
Monocytes Relative: 1 %
Neutro Abs: 4.8 10*3/uL (ref 1.7–7.7)
Neutrophils Relative %: 89 %
Platelets: 280 10*3/uL (ref 150–400)
RBC: 2.78 MIL/uL — ABNORMAL LOW (ref 3.87–5.11)
RDW: 14.8 % (ref 11.5–15.5)
WBC: 5.4 10*3/uL (ref 4.0–10.5)
nRBC: 0 % (ref 0.0–0.2)

## 2020-05-15 LAB — IRON AND TIBC
Iron: 53 ug/dL (ref 28–170)
Saturation Ratios: 33 % — ABNORMAL HIGH (ref 10.4–31.8)
TIBC: 160 ug/dL — ABNORMAL LOW (ref 250–450)
UIBC: 107 ug/dL

## 2020-05-15 LAB — TROPONIN I (HIGH SENSITIVITY)
Troponin I (High Sensitivity): 60 ng/L — ABNORMAL HIGH (ref <18)
Troponin I (High Sensitivity): 63 ng/L — ABNORMAL HIGH (ref <18)

## 2020-05-15 LAB — VITAMIN D 25 HYDROXY (VIT D DEFICIENCY, FRACTURES): Vit D, 25-Hydroxy: 28.94 ng/mL — ABNORMAL LOW (ref 30–100)

## 2020-05-15 LAB — POTASSIUM: Potassium: 4.7 mmol/L (ref 3.5–5.1)

## 2020-05-15 LAB — FERRITIN: Ferritin: 801 ng/mL — ABNORMAL HIGH (ref 11–307)

## 2020-05-15 LAB — GLUCOSE, CAPILLARY
Glucose-Capillary: 136 mg/dL — ABNORMAL HIGH (ref 70–99)
Glucose-Capillary: 131 mg/dL — ABNORMAL HIGH (ref 70–99)

## 2020-05-15 LAB — HEPARIN LEVEL (UNFRACTIONATED)
Heparin Unfractionated: 0.74 IU/mL — ABNORMAL HIGH (ref 0.30–0.70)
Heparin Unfractionated: 1.35 IU/mL — ABNORMAL HIGH (ref 0.30–0.70)

## 2020-05-15 MED ORDER — MIRTAZAPINE 15 MG PO TABS
7.5000 mg | ORAL_TABLET | Freq: Every day | ORAL | Status: DC
  Administered 2020-05-15 – 2020-05-21 (×7): 7.5 mg via ORAL
  Filled 2020-05-15 (×7): qty 1

## 2020-05-15 MED ORDER — HEPARIN (PORCINE) 25000 UT/250ML-% IV SOLN
850.0000 [IU]/h | INTRAVENOUS | Status: DC
  Administered 2020-05-15: 950 [IU]/h via INTRAVENOUS
  Filled 2020-05-15: qty 250

## 2020-05-15 MED ORDER — HEPARIN BOLUS VIA INFUSION
3000.0000 [IU] | Freq: Once | INTRAVENOUS | Status: AC
  Administered 2020-05-15: 3000 [IU] via INTRAVENOUS

## 2020-05-15 MED ORDER — EXEMESTANE 25 MG PO TABS
25.0000 mg | ORAL_TABLET | Freq: Every day | ORAL | Status: DC
  Administered 2020-05-15 – 2020-05-21 (×6): 25 mg via ORAL
  Filled 2020-05-15 (×10): qty 1

## 2020-05-15 MED ORDER — HEPARIN SODIUM (PORCINE) 1000 UNIT/ML DIALYSIS
1000.0000 [IU] | INTRAMUSCULAR | Status: DC | PRN
  Filled 2020-05-15 (×2): qty 1

## 2020-05-15 MED ORDER — METOPROLOL TARTRATE 5 MG/5ML IV SOLN
5.0000 mg | Freq: Four times a day (QID) | INTRAVENOUS | Status: DC | PRN
  Administered 2020-05-15 – 2020-05-16 (×2): 5 mg via INTRAVENOUS
  Filled 2020-05-15 (×2): qty 5

## 2020-05-15 MED ORDER — CINACALCET HCL 30 MG PO TABS
30.0000 mg | ORAL_TABLET | Freq: Every day | ORAL | Status: DC
  Administered 2020-05-17 – 2020-05-21 (×5): 30 mg via ORAL
  Filled 2020-05-15 (×8): qty 1

## 2020-05-15 NOTE — TOC Initial Note (Signed)
Transition of Care [facility]) - Initial/Assessment Note    Patient Details  Name: Rose Freeman MRN: [id number] Date of Birth: 20-Mar-1941  Transition of Care [facility]) CM/SW Contact:    Ihor Gully, LCSW Phone Number: 05/15/2020, 3:40 PM  Clinical Narrative:                 Patient from home ([place]) with adult daughter. Admitted COVID+. Stage 5 ESRD. Uses cane and walker. Independent with ADLs. Daughter transports to medical appointment. Has cane, walker, [place], bsc.  Will need new start HD completed.  Clinicals faxed to [facility] in [place], [place] per daughter's request.   Expected Discharge Plan: [place] Barriers to Discharge: Continued Medical Work up   Patient Goals and CMS Choice Patient states their goals for this hospitalization and ongoing recovery are:: return home      Expected Discharge Plan and Services Expected Discharge Plan: [place]   Discharge Planning Services:  (Cane, walker, , bsc)   Living arrangements for the past 2 months: Mobile Home                                      Prior Living Arrangements/Services Living arrangements for the past 2 months: Mobile Home Lives with:: Adult Children Patient language and need for interpreter reviewed:: Yes Do you feel safe going back to the place where you live?: Yes      Need for Family Participation in Patient Care: Yes (Comment) Care giver support system in place?: Yes (comment) Current home services: DME Criminal Activity/Legal Involvement Pertinent to Current Situation/Hospitalization: No - Comment as needed  Activities of Daily Living      Permission Sought/Granted Permission sought to share information with : Family Supports    Share Information with NAME: [place] granted to share info w Relationship: daughter     Emotional Assessment     Affect (typically observed): Appropriate Orientation: : Oriented to  Self,Oriented to Place,Oriented to  Time,Oriented to Situation Alcohol / Substance Use: Not Applicable    Admission diagnosis:  Hyperkalemia [E87.5] Patient Active Problem List   Diagnosis Date Noted  . Acute hyperkalemia 05/14/2020  . Accelerated hypertension 05/14/2020  . CKD (chronic kidney disease), stage V ([place]) 05/14/2020  . Acute renal failure superimposed on stage 5 chronic kidney disease, not on chronic dialysis ([place]) 05/14/2020  . COVID-19 virus infection 05/14/2020   PCP:  Neale Burly, MD Pharmacy:   [facility] [place] [address] Phone: [phone] Fax: [phone]     Social Determinants of Health (SDOH) Interventions    Readmission Risk Interventions No flowsheet data found.

## 2020-05-15 NOTE — Procedures (Signed)
   HEMODIALYSIS TREATMENT NOTE (HD#2):  Max Qb 200cc/m despite all known trouble-shooting measures--discussed with Dr. Constance Haw who will place [facility] tomorrow, if OR schedule permits.  UF was limited by hypotension.  Net UF 954cc.  All blood was returned.  No changes from pre-HD assessment.  Rockwell Alexandria, RN

## 2020-05-15 NOTE — ED Notes (Signed)
Upon making rounds on patient, patient had a large runny green in color bowel movement that had ran into the floor, Patient washed up and new linens and gown in place. Reminded patient that if she feels the need to go to push her call bell. Patient showed that she understood.

## 2020-05-15 NOTE — ED Notes (Addendum)
Sec/NT informed RN that pt's HR increased to 136bpm. RN went to administer prn order for Metoprolol, but pt's HR decreased back down to 63bpm prior to administering medication.

## 2020-05-15 NOTE — Progress Notes (Signed)
Nephrology Follow-Up Consult note   Assessment/Recommendations: Rose Freeman is a/an 80 y.o. female with a past medical history significant for CHF, HTN, hypothyroidism, CAD s/p CABG, and CKD V, admitted for AKI on CKD V and COVID pneumonia.     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  Had plans for peritoneal dialysis but required dialysis emergently for uremia and hyperkalemia.  Will need to transition to PD after being established on outpatient hemodialysis. -Plan for dialysis again today and tomorrow -Then maintain MWF schedule for now -Transition to tunneled TDC early next week -Dose medications based on GFR of <15 -Monitor Daily I/Os, Daily weight   COVID pna: bilateral infiltrates covid positive. Unvaccinated. Fortunately no O2 requirement. Treatment per primary  Hypertension: BP markedly elevated. Holding dilt and BB due to bradycardia. On amlodipine and imdur. Likely will improve with fluid removal on dialysis. Consider oral hydral if needed  Hyperkalemia: resolved with HD. Stop lokelma and continue HD as above  Secondary hyperparathyroidism: Phos 8.5. Likely will improve with HD. Can restart home sensipar.  Anemia 2/2 CKD: Hgb 9.7. Check iron stores. Consider starting ESA based on trend.   Recommendations conveyed to primary service.    [facility] 05/15/2020 9:30 AM  ___________________________________________________________  CC: AKI on CKD V, uremia, hyperkalemia  Interval History/Subjective: Patient evaluated yesterday with significant renal failure, hyperkalemia, uremia.  Patient underwent 2 hours of dialysis last night tolerated well.  Continues to be somewhat confused.  Hypertensive.  Potassium improved to normal limits.  Planning for dialysis again today  Medications:  Current Facility-Administered Medications  Medication Dose Route Frequency Provider Last Rate Last Admin  . 0.9 %  sodium chloride infusion  100 mL Intravenous PRN  Dwana Melena, MD      . 0.9 %  sodium chloride infusion  100 mL Intravenous PRN Dwana Melena, MD      . alteplase (CATHFLO ACTIVASE) injection 2 mg  2 mg Intracatheter Once PRN Dwana Melena, MD      . amLODipine (NORVASC) tablet 5 mg  5 mg Oral Daily Elgergawy, Silver Huguenin, MD   5 mg at 05/15/20 1219  . Chlorhexidine Gluconate Cloth 2 % PADS 6 each  6 each Topical Q0600 Dwana Melena, MD      . exemestane (AROMASIN) tablet 25 mg  25 mg Oral QPC breakfast Barton Dubois, MD   25 mg at 05/15/20 7588  . heparin ADULT infusion 100 units/mL (25000 units/238mL)  950 Units/hr Intravenous Continuous Franky Macho, RPH 9.5 mL/hr at 05/15/20 0216 950 Units/hr at 05/15/20 0216  . heparin injection 1,000 Units  1,000 Units Dialysis PRN Dwana Melena, MD      . hydrALAZINE (APRESOLINE) injection 5 mg  5 mg Intravenous Q6H PRN Elgergawy, Silver Huguenin, MD   5 mg at 05/14/20 2122  . isosorbide mononitrate (IMDUR) 24 hr tablet 60 mg  60 mg Oral Daily Elgergawy, Silver Huguenin, MD   60 mg at 05/15/20 0904  . methylPREDNISolone sodium succinate (SOLU-MEDROL) 125 mg/2 mL injection 75 mg  1 mg/kg Intravenous Q12H Elgergawy, Silver Huguenin, MD   75 mg at 05/15/20 0904   Followed by  . [START ON 05/17/2020] predniSONE (DELTASONE) tablet 50 mg  50 mg Oral Daily Elgergawy, Silver Huguenin, MD      . metoprolol tartrate (LOPRESSOR) injection 5 mg  5 mg Intravenous Q6H PRN Zierle-Ghosh, Asia B, DO   5 mg at 05/15/20 0211  . pantoprazole (PROTONIX) EC  tablet 40 mg  40 mg Oral Daily Elgergawy, Silver Huguenin, MD   40 mg at 05/15/20 0903  . remdesivir 100 mg in sodium chloride 0.9 % 100 mL IVPB  100 mg Intravenous Daily Pham, Minh Q, RPH-CPP       Current Outpatient Medications  Medication Sig Dispense Refill  . albuterol (VENTOLIN HFA) 108 (90 Base) MCG/ACT inhaler Inhale 2 puffs into the lungs every 4 (four) hours as needed for wheezing or shortness of breath.    . cinacalcet (SENSIPAR) 30 MG tablet Take 30 mg by mouth daily.    Marland Kitchen exemestane (AROMASIN) 25  MG tablet Take 25 mg by mouth daily.    . FEROSUL 325 (65 Fe) MG tablet Take 325 mg by mouth 2 (two) times daily.    . furosemide (LASIX) 20 MG tablet Take 40 mg by mouth 2 (two) times daily. Take two (2) tablets twice daily    . isosorbide mononitrate (IMDUR) 60 MG 24 hr tablet Take 60 mg by mouth daily.    . mirtazapine (REMERON) 7.5 MG tablet Take 7.5 mg by mouth at bedtime.    Marland Kitchen NIFEdipine (PROCARDIA XL/NIFEDICAL-XL) 90 MG 24 hr tablet Take 90 mg by mouth daily.         Physical Exam: Vitals:   05/15/20 0600 05/15/20 0904  BP: (!) 187/76 (!) 199/99  Pulse: (!) 46 (!) 54  Resp: 14 (!) 21  Temp:    SpO2: 96% 94%   No intake/output data recorded.  Intake/Output Summary (Last 24 hours) at 05/15/2020 0930 Last data filed at 05/14/2020 2305 Gross per 24 hour  Intake 50 ml  Output 1000 ml  Net -950 ml   Constitutional: Alert, well-appearing, no obvious distress, undergoing ultrasound of legs ENMT: ears and nose without scars or lesions, MMM CV: Bradycardia, rhythm appears normal Respiratory: Bilateral chest rise without any increased work of breathing Gastrointestinal: soft, non-tender, no palpable masses or hernias Skin: no visible lesions or rashes Ext: right femoral temporary dialysis catheter in place Psych: Awake, alert, answers questions appropriately, oriented to place and person   Test Results I personally reviewed new and old clinical labs and radiology tests Lab Results  Component Value Date   NA 138 05/15/2020   K 4.7 05/15/2020   CL 101 05/15/2020   CO2 20 (L) 05/15/2020   BUN 117 (H) 05/15/2020   CREATININE 20.04 (H) 05/15/2020   CALCIUM 8.6 (L) 05/15/2020   ALBUMIN 2.6 (L) 05/15/2020   PHOS 8.5 (H) 05/14/2020

## 2020-05-15 NOTE — Progress Notes (Addendum)
ANTICOAGULATION CONSULT NOTE  Pharmacy Consult for Heparin Indication: atrial fibrillation  No Known Allergies  Patient Measurements: Height: 5\' 3"  (160 cm) Weight: 63.3 kg (139 lb 8.8 oz) IBW/kg (Calculated) : 52.4 Heparin Dosing Weight: 68 kg  Vital Signs: Temp: 97.9 F (36.6 C) (02/03 1112) Temp Source: Oral (02/03 1112) BP: 124/59 (02/03 2045) Pulse Rate: 77 (02/03 2045)  Labs: Recent Labs    05/14/20 1425 05/14/20 1807 05/15/20 0030 05/15/20 0500 05/15/20 1021 05/15/20 2055  HGB 9.7*  --   --  9.7*  --   --   HCT 27.0*  --   --  27.3*  --   --   PLT 353  --   --  280  --   --   HEPARINUNFRC  --   --   --   --  0.74* 1.35*  CREATININE 27.96* 27.78*  --  20.04*  --   --   TROPONINIHS  --   --  60* 63*  --   --     Estimated Creatinine Clearance: 2.1 mL/min (A) (by C-G formula based on SCr of 20.04 mg/dL (H)).   Medical History: Past Medical History:  Diagnosis Date  . Cancer ([place])   . CHF (congestive heart failure) ([place])   . Chronic kidney disease   . Hypertension     Assessment: 80 yr old female presented with COVID. Noted pt with AKI requiring emergent HD. To begin heparin for afib. No anticoagulants PTA. D-dimer 8.96>10.56, Hgb 9.7, Hct 27.3, platelets 280.  Heparin level ~10 hrs after heparin infusion was decreased to 850 units/hr was 1.35 units/ml, which is above the goal range for this pt. The HL was drawn by the HD RN; it's possible that the heparin level could have been affected by heparin used for HD.  Heparin was ordered to be stopped at midnight tonight for surgery tomorrow [facility]). Since we would need to hold the heparin infusion for at least 1 hr, due to high heparin level, spoke with RN and will hold heparin now and will not restart prior to surgery. Per RN, no issues with IV or bleeding observed.  Goals: Heparin level: 0.3-0.7 units/ml Monitor platelets by anticoagulation protocol: Yes   Plan:  Stop heparin infusion now, due to high heparin  level and surgery planned for tomorrow, 05/16/20 Monitor for bleeding F/U anticoagulation plan after surgery  Gillermina Hu, PharmD, BCPS, [facility] Clinical Pharmacist 05/15/2020 9:55 PM

## 2020-05-15 NOTE — Progress Notes (Signed)
ANTICOAGULATION CONSULT NOTE -   Pharmacy Consult for Heparin Indication: atrial fibrillation  No Known Allergies  Patient Measurements: Height: 5\' 3"  (160 cm) Weight: 74.8 kg (164 lb 14.5 oz) IBW/kg (Calculated) : 52.4 Heparin Dosing Weight: 68 kg  Vital Signs: BP: 188/102 (02/03 1000) Pulse Rate: 58 (02/03 1000)  Labs: Recent Labs    05/14/20 1425 05/14/20 1807 05/15/20 0030 05/15/20 0500 05/15/20 1021  HGB 9.7*  --   --  9.7*  --   HCT 27.0*  --   --  27.3*  --   PLT 353  --   --  280  --   HEPARINUNFRC  --   --   --   --  0.74*  CREATININE 27.96* 27.78*  --  20.04*  --   TROPONINIHS  --   --  60* 63*  --     Estimated Creatinine Clearance: 2.3 mL/min (A) (by C-G formula based on SCr of 20.04 mg/dL (H)).   Medical History: Past Medical History:  Diagnosis Date  . Cancer ([place])   . CHF (congestive heart failure) ([place])   . Chronic kidney disease   . Hypertension     Medications:  See electronic med rec  Assessment: 80 y.o. F presents with COVID. Noted pt with AKI requiring emergent HD. To begin heparin for afib. No AC PTA. D-dimer 8.96, Hgb 9.7, Hct 27.   HL 0.74- slightly supratherapeutic  Goal of Therapy:  Heparin level 0.3-0.7 units/ml Monitor platelets by anticoagulation protocol: Yes   Plan:  Decrease Heparin infusion to 850 units/hr Will f/u heparin level in 8 hours Daily heparin level and CBC  Margot Ables, PharmD Clinical Pharmacist 05/15/2020 11:00 AM

## 2020-05-15 NOTE — Progress Notes (Signed)
[facility]  Femoral dialysis catheter without great flows. Will try to do tunneled upper line tomorrow. K better this Am.  COVID positive. Will hold heparin gtt at midnight for Afib, and NPO midnight. Will have to discuss with her daughter in the AM.  Curlene Labrum, MD [facility] [address] [place], [place] [address] [phone] (office)

## 2020-05-15 NOTE — Progress Notes (Signed)
ANTICOAGULATION CONSULT NOTE - Initial Consult  Pharmacy Consult for Heparin Indication: atrial fibrillation  No Known Allergies  Patient Measurements: Height: 5\' 3"  (160 cm) Weight: 74.8 kg (164 lb 14.5 oz) IBW/kg (Calculated) : 52.4 Heparin Dosing Weight: 68 kg  Vital Signs: BP: 146/111 (02/03 0100) Pulse Rate: 143 (02/03 0100)  Labs: Recent Labs    05/14/20 1425 05/14/20 1807  HGB 9.7*  --   HCT 27.0*  --   PLT 353  --   CREATININE 27.96* 27.78*    Estimated Creatinine Clearance: 1.6 mL/min (A) (by C-G formula based on SCr of 27.78 mg/dL (H)).   Medical History: Past Medical History:  Diagnosis Date  . Cancer ([place])   . CHF (congestive heart failure) ([place])   . Chronic kidney disease   . Hypertension     Medications:  See electronic med rec  Assessment: 80 y.o. F presents with COVID. Noted pt with AKI requiring emergent HD. To begin heparin for afib. No AC PTA. D-dimer 8.96, Hgb 9.7, Hct 27. SQ heparin 5000 units given 2/2 2130.  Goal of Therapy:  Heparin level 0.3-0.7 units/ml Monitor platelets by anticoagulation protocol: Yes   Plan:  D/C SQ heparin Heparin IV bolus 3000 units Heparin gtt at 950 units/hr Will f/u heparin level in 8 hours Daily heparin level and CBC  Sherlon Handing, PharmD, BCPS Please see amion for complete clinical pharmacist phone list 05/15/2020,1:41 AM

## 2020-05-15 NOTE — Progress Notes (Signed)
PROGRESS NOTE    Rose Freeman  ZOX:[id number] DOB: Jan 14, 1941 DOA: 05/14/2020 PCP: Neale Burly, MD   Chief Complaint  Patient presents with  . Weakness    Brief admission Narrative:  Rose Freeman  is a 80 y.o. female, with past medical history of CKD stage V, anemia, unspecified, CHF unspecified, hypothyroidism, hypertension, CAD status post CABG, patient presents to ED secondary to cough and weakness, she is unvaccinated against Covid, she presents with cough for last 4 days, patient is very poor historian, it was obtained from daughter by phone, patient with stage V kidney disease, being planned for peritoneal dialysis, upon presentation to ED work-up significant for creatinine of 28, potassium of 7.3, BUN of 177, and she is COVID-19 positive, with chest x-ray for multifocal opacity, but no oxygen requirement, she has anemia at 9.7, afebrile, no leukocytosis, normal lactic acid and D-dimer elevated at 8.9, [facility] hospitalist consulted to admit.  Assessment & Plan: 1-acute kidney injury on chronic kidney disease stage V: with hyperkalemia -Emergently started on dialysis -Acute intervention is in Kayexalate, D50/insulin and sodium bicarbonate as well controlled in potassium level at this time. -Continue to follow nephrology service recommendations for further dialysis treatment. -Maintain adequate hydration. -Adjust medications to appropriate Creatinine clearance.  2-anemia of chronic kidney disease -Will follow nephrology service recommendations for epogen and IV iron -No overt BLEEDING appreciated -follow Hgb trend  3-hx of CAD -no CP currently -continue telemetry monitoring -continue Imdur  4-COVID-19 infection -family reported worsening SOB and hypoxia with ambulation -when able will check desaturation screening -continue tx with remdesivir and steroids -no fever -follow inflammatory markers.  5-HTN -anticipating HD will help with BP control -continue current  antihypertensive agents.   6-hyperphosphatemia -continue HD -resume sensipar   7-physical deconditioning  -will arrange for home health services at discharge -generalized weakness in the setting of COVID -19 infection and uremia.   DVT prophylaxis: heparin  Code Status: Full code. Family Communication: no family at bedside. Disposition:   Status is: Inpatient  Dispo: The patient is from: home.              Anticipated d/c is to: home with home health services.              Anticipated d/c date is: 3-4 days              Patient currently no medically stable for discharge; continue to follow nephrology guidelines for further hemodialysis and anticipated tunneled catheter placement next week.  Continue treatment with steroids and remdesivir for COVID infection.   Difficult to place patient : no       Consultants:   Nephrology service  General surgery for dialysis catheter placement.   Procedures:  See below reports   Antimicrobials/antiviral -Remdesivir   Subjective: Afebrile, reports no chest pain, no nausea, no vomiting.  No labor breathing While resting.  Good saturation on RA..  Objective: Vitals:   05/15/20 1600 05/15/20 1645 05/15/20 1646 05/15/20 1700  BP: (!) 152/84 (!) 208/100 (!) 224/91 (!) 171/78  Pulse: 98 95 64 88  Resp: 18 17 16 15   Temp:      TempSrc:      SpO2: 100% 97% 97% 95%  Weight:  63.3 kg    Height:  5\' 3"  (1.6 m)      Intake/Output Summary (Last 24 hours) at 05/15/2020 1733 Last data filed at 05/15/2020 1709 Gross per 24 hour  Intake 311.7 ml  Output 1000 ml  Net -688.3  ml   Filed Weights   05/14/20 1148 05/14/20 2100 05/15/20 1645  Weight: 74.8 kg 74.8 kg 63.3 kg    Examination:  General exam: Appears calm and comfortable at rest; denies chest pain, no nausea, no vomiting.  Patient expressed significant tachypnea and shortness of breath with exertion.  No requiring oxygen supplementation at rest. Respiratory system: Bibasilar  Rales; no wheezing, no using accessory muscles. Cardiovascular system: S1 & S2 heard, no rubs, no gallops, no JVD Gastrointestinal system: Abdomen is nondistended, soft and nontender. No organomegaly or masses felt. Normal bowel sounds heard. Central nervous system: No focal neurological deficits. Extremities: Trace edema bilaterally; no cyanosis, no clubbing.  Right femoral temporary dialysis catheter in place. Skin: No petechiae. Psychiatry: Mood & affect appropriate.     Data Reviewed: I have personally reviewed following labs and imaging studies  CBC: Recent Labs  Lab 05/14/20 1425 05/15/20 0500  WBC 9.4 5.4  NEUTROABS 6.8 4.8  HGB 9.7* 9.7*  HCT 27.0* 27.3*  MCV 102.3* 98.2  PLT 353 086    Basic Metabolic Panel: Recent Labs  Lab 05/14/20 1422 05/14/20 1425 05/14/20 1807 05/15/20 0030 05/15/20 0500  NA  --  139 139  --  138  K  --  7.3* 6.6* 4.7 4.7  CL  --  107 107  --  101  CO2  --  13* 15*  --  20*  GLUCOSE  --  79 119*  --  166*  BUN  --  177* 177*  --  117*  CREATININE  --  27.96* 27.78*  --  20.04*  CALCIUM  --  9.0 9.0  --  8.6*  PHOS 8.5*  --   --   --   --     GFR: Estimated Creatinine Clearance: 2.1 mL/min (A) (by C-G formula based on SCr of 20.04 mg/dL (H)).  Liver Function Tests: Recent Labs  Lab 05/14/20 1425 05/15/20 0500  AST 20 18  ALT 10 9  ALKPHOS 74 69  BILITOT 0.9 0.7  PROT 8.0 7.1  ALBUMIN 3.1* 2.6*    CBG: Recent Labs  Lab 05/14/20 2104 05/15/20 0210 05/15/20 0821 05/15/20 1205 05/15/20 1639  GLUCAP 76 196* 143* 142* 136*     Recent Results (from the past 240 hour(s))  Blood Culture (routine x 2)     Status: None (Preliminary result)   Collection Time: 05/14/20  2:23 PM   Specimen: BLOOD  Result Value Ref Range Status   Specimen Description BLOOD LEFT WRIST  Final   Special Requests   Final    BOTTLES DRAWN AEROBIC AND ANAEROBIC Blood Culture adequate volume   Culture   Final    NO GROWTH < 24 HOURS Performed  at [facility], [address] [place], [place] [address]    Report Status PENDING  Incomplete  Blood Culture (routine x 2)     Status: None (Preliminary result)   Collection Time: 05/14/20  2:24 PM   Specimen: BLOOD  Result Value Ref Range Status   Specimen Description BLOOD LEFT HAND  Final   Special Requests   Final    BOTTLES DRAWN AEROBIC AND ANAEROBIC Blood Culture adequate volume   Culture   Final    NO GROWTH < 24 HOURS Performed at [facility], [address], [place], [place] [address]    Report Status PENDING  Incomplete  SARS Coronavirus 2 by RT PCR (hospital order, performed in [facility] hospital lab) Nasopharyngeal Nasopharyngeal Swab  Status: Abnormal   Collection Time: 05/14/20  2:28 PM   Specimen: Nasopharyngeal Swab  Result Value Ref Range Status   SARS Coronavirus 2 POSITIVE (A) NEGATIVE Final    Comment: RESULT CALLED TO, READ BACK BY AND VERIFIED WITH: CRAWFORD,H RN @1540  05/14/20 BY JONES,T (NOTE) SARS-CoV-2 target nucleic acids are DETECTED  SARS-CoV-2 RNA is generally detectable in upper respiratory specimens  during the acute phase of infection.  Positive results are indicative  of the presence of the identified virus, but do not rule out bacterial infection or co-infection with other pathogens not detected by the test.  Clinical correlation with patient history and  other diagnostic information is necessary to determine patient infection status.  The expected result is negative.  Fact Sheet for Patients:   StrictlyIdeas.no   Fact Sheet for Healthcare Providers:   BankingDealers.co.za    This test is not yet approved or cleared by the [place] FDA and  has been authorized for detection and/or diagnosis of SARS-CoV-2 by FDA under an Emergency Use Authorization (EUA).  This EUA will remain in effect (meaning this  test can be used) for the duration of  the COVID-19 declaration under Section  564(b)(1) of the Act, 21 U.S.C. section 360-bbb-3(b)(1), unless the authorization is terminated or revoked sooner.  Performed at [facility], [address] [place], [place] [address]      Radiology Studies: US Venous Img Lower Bilateral (DVT)  Result Date: 05/15/2020 CLINICAL DATA:  Elevated D-dimer level. EXAM: BILATERAL LOWER EXTREMITY VENOUS DOPPLER ULTRASOUND TECHNIQUE: Gray-scale sonography with graded compression, as well as color Doppler and duplex ultrasound were performed to evaluate the lower extremity deep venous systems from the level of the common femoral vein and including the common femoral, femoral, profunda femoral, popliteal and calf veins including the posterior tibial, peroneal and gastrocnemius veins when visible. The superficial great saphenous vein was also interrogated. Spectral Doppler was utilized to evaluate flow at rest and with distal augmentation maneuvers in the common femoral, femoral and popliteal veins. COMPARISON:  None. FINDINGS: RIGHT LOWER EXTREMITY Common Femoral Vein: Not well visualized due to overlying bandages. Saphenofemoral Junction: Not well visualized due to overlying bandages. Profunda Femoral Vein: No evidence of thrombus. Normal compressibility and flow on color Doppler imaging. Femoral Vein: No evidence of thrombus. Normal compressibility, respiratory phasicity and response to augmentation. Popliteal Vein: No evidence of thrombus. Normal compressibility, respiratory phasicity and response to augmentation. Calf Veins: No evidence of thrombus. Normal compressibility and flow on color Doppler imaging. Superficial Great Saphenous Vein: No evidence of thrombus. Normal compressibility. Venous Reflux:  None. Other Findings:  None. LEFT LOWER EXTREMITY Common Femoral Vein: Not well visualized due to overlying bandages. Saphenofemoral Junction: Not well visualized due to overlying bandages. Profunda Femoral Vein: No evidence of thrombus. Normal compressibility  and flow on color Doppler imaging. Femoral Vein: No evidence of thrombus. Normal compressibility, respiratory phasicity and response to augmentation. Popliteal Vein: No evidence of thrombus. Normal compressibility, respiratory phasicity and response to augmentation. Calf Veins: No evidence of thrombus. Normal compressibility and flow on color Doppler imaging. Superficial Great Saphenous Vein: No evidence of thrombus. Normal compressibility. Venous Reflux:  None. Other Findings:  None. IMPRESSION: No evidence of deep venous thrombosis in either lower extremity within the visualized venous structures. The common femoral and saphenofemoral junctions bilaterally are not well visualized due to overlying bandages. Electronically Signed   By: Marijo Conception M.D.   On: 05/15/2020 10:08   DG Chest [facility] [address]  Result Date: 05/14/2020 CLINICAL DATA:  Cough EXAM: PORTABLE CHEST 1 VIEW COMPARISON:  10/22/2019. FINDINGS: Mildly enlarged cardiac silhouette. Status post CABG and median sternotomy. Aortic atherosclerosis. New peripheral predominant interstitial opacities. Left basilar atelectasis. No visible pleural effusions or pneumothorax. IMPRESSION: New peripheral predominant interstitial opacities which could represent atypical infection or interstitial edema. Left basilar atelectasis. Electronically Signed   By: Margaretha Sheffield MD   On: 05/14/2020 13:01   Scheduled Meds: . amLODipine  5 mg Oral Daily  . Chlorhexidine Gluconate Cloth  6 each Topical Q0600  . [START ON 05/16/2020] cinacalcet  30 mg Oral Q breakfast  . exemestane  25 mg Oral QPC breakfast  . isosorbide mononitrate  60 mg Oral Daily  . methylPREDNISolone (SOLU-MEDROL) injection  1 mg/kg Intravenous Q12H   Followed by  . [START ON 05/17/2020] predniSONE  50 mg Oral Daily  . pantoprazole  40 mg Oral Daily   Continuous Infusions: . sodium chloride    . sodium chloride    . heparin 850 Units/hr (05/15/20 1709)  . remdesivir 100 mg in NS 100 mL  Stopped (05/15/20 1556)     LOS: 1 day    Time spent: 35 minutes  Barton Dubois, MD [facility]   To contact the attending provider between 7A-7P or the covering provider during after hours 7P-7A, please log into the web site [email or web address] and access using universal [place] password for that web site. If you do not have the password, please call the hospital operator.  05/15/2020, 5:33 PM

## 2020-05-15 NOTE — ED Notes (Signed)
Patient's heart rate elevated, repeat EKG done. EKG showed to RN Vivien Rota, EKG shown to Dr Stark Jock. Instructed to call admitting hospitalist. Hospitalist paged to Charge phone Vivien Rota [phone]

## 2020-05-15 NOTE — Progress Notes (Signed)
Stopped Heparin after notification by Pharmacy of need to stop for at least 1 hour. IV Heparin also ordered to be stopped at midnight.

## 2020-05-16 ENCOUNTER — Inpatient Hospital Stay (HOSPITAL_COMMUNITY): Payer: Medicare Other | Admitting: Anesthesiology

## 2020-05-16 ENCOUNTER — Inpatient Hospital Stay (HOSPITAL_COMMUNITY): Payer: Medicare Other

## 2020-05-16 ENCOUNTER — Encounter (HOSPITAL_COMMUNITY)
Admission: EM | Disposition: A | Discharge status: Home Health | Payer: Self-pay | Source: Home / Self Care | Attending: Internal Medicine

## 2020-05-16 ENCOUNTER — Encounter (HOSPITAL_COMMUNITY): Payer: Self-pay | Admitting: Internal Medicine

## 2020-05-16 HISTORY — PX: INSERTION OF DIALYSIS CATHETER: SHX1324

## 2020-05-16 LAB — CBC WITH DIFFERENTIAL/PLATELET
Abs Immature Granulocytes: 0.05 10*3/uL (ref 0.00–0.07)
Basophils Absolute: 0 10*3/uL (ref 0.0–0.1)
Basophils Relative: 0 %
Eosinophils Absolute: 0 10*3/uL (ref 0.0–0.5)
Eosinophils Relative: 0 %
HCT: 25.8 % — ABNORMAL LOW (ref 36.0–46.0)
Hemoglobin: 8.9 g/dL — ABNORMAL LOW (ref 12.0–15.0)
Immature Granulocytes: 1 %
Lymphocytes Relative: 9 %
Lymphs Abs: 0.7 10*3/uL (ref 0.7–4.0)
MCH: 33.8 pg (ref 26.0–34.0)
MCHC: 34.5 g/dL (ref 30.0–36.0)
MCV: 98.1 fL (ref 80.0–100.0)
Monocytes Absolute: 0.1 10*3/uL (ref 0.1–1.0)
Monocytes Relative: 1 %
Neutro Abs: 7.4 10*3/uL (ref 1.7–7.7)
Neutrophils Relative %: 89 %
Platelets: 210 10*3/uL (ref 150–400)
RBC: 2.63 MIL/uL — ABNORMAL LOW (ref 3.87–5.11)
RDW: 14.2 % (ref 11.5–15.5)
WBC: 8.3 10*3/uL (ref 4.0–10.5)
nRBC: 0 % (ref 0.0–0.2)

## 2020-05-16 LAB — GLUCOSE, CAPILLARY
Glucose-Capillary: 124 mg/dL — ABNORMAL HIGH (ref 70–99)
Glucose-Capillary: 132 mg/dL — ABNORMAL HIGH (ref 70–99)
Glucose-Capillary: 139 mg/dL — ABNORMAL HIGH (ref 70–99)
Glucose-Capillary: 139 mg/dL — ABNORMAL HIGH (ref 70–99)
Glucose-Capillary: 143 mg/dL — ABNORMAL HIGH (ref 70–99)
Glucose-Capillary: 145 mg/dL — ABNORMAL HIGH (ref 70–99)
Glucose-Capillary: 171 mg/dL — ABNORMAL HIGH (ref 70–99)

## 2020-05-16 LAB — COMPREHENSIVE METABOLIC PANEL
ALT: 9 U/L (ref 0–44)
AST: 18 U/L (ref 15–41)
Albumin: 2.5 g/dL — ABNORMAL LOW (ref 3.5–5.0)
Alkaline Phosphatase: 57 U/L (ref 38–126)
Anion gap: 15 (ref 5–15)
BUN: 86 mg/dL — ABNORMAL HIGH (ref 8–23)
CO2: 23 mmol/L (ref 22–32)
Calcium: 8.8 mg/dL — ABNORMAL LOW (ref 8.9–10.3)
Chloride: 99 mmol/L (ref 98–111)
Creatinine, Ser: 14.76 mg/dL — ABNORMAL HIGH (ref 0.44–1.00)
GFR, Estimated: 2 mL/min — ABNORMAL LOW (ref >60)
Glucose, Bld: 149 mg/dL — ABNORMAL HIGH (ref 70–99)
Potassium: 4 mmol/L (ref 3.5–5.1)
Sodium: 137 mmol/L (ref 135–145)
Total Bilirubin: 0.7 mg/dL (ref 0.3–1.2)
Total Protein: 6.7 g/dL (ref 6.5–8.1)

## 2020-05-16 LAB — HEPATITIS B SURFACE ANTIGEN: Hepatitis B Surface Ag: NONREACTIVE

## 2020-05-16 LAB — HEPATITIS B CORE ANTIBODY, TOTAL: Hep B Core Total Ab: NONREACTIVE

## 2020-05-16 LAB — D-DIMER, QUANTITATIVE: D-Dimer, Quant: 7.2 ug/mL-FEU — ABNORMAL HIGH (ref 0.00–0.50)

## 2020-05-16 SURGERY — INSERTION OF DIALYSIS CATHETER
Anesthesia: General | Site: Chest | Laterality: Right

## 2020-05-16 MED ORDER — HEPARIN 1000 UNIT/ML FOR PERITONEAL DIALYSIS
INTRAMUSCULAR | Status: DC | PRN
  Administered 2020-05-16: 4000 [IU] via INTRAPERITONEAL

## 2020-05-16 MED ORDER — PHENYLEPHRINE HCL (PRESSORS) 10 MG/ML IV SOLN
INTRAVENOUS | Status: DC | PRN
  Administered 2020-05-16: 120 ug via INTRAVENOUS
  Administered 2020-05-16 (×2): 80 ug via INTRAVENOUS

## 2020-05-16 MED ORDER — LIDOCAINE HCL (PF) 1 % IJ SOLN
INTRAMUSCULAR | Status: DC | PRN
  Administered 2020-05-16: 6 mL

## 2020-05-16 MED ORDER — FENTANYL CITRATE (PF) 100 MCG/2ML IJ SOLN
INTRAMUSCULAR | Status: DC | PRN
  Administered 2020-05-16: 25 ug via INTRAVENOUS

## 2020-05-16 MED ORDER — ORAL CARE MOUTH RINSE: 15.0000 mL | Freq: Once | OROMUCOSAL | Status: DC

## 2020-05-16 MED ORDER — LIDOCAINE HCL (PF) 1 % IJ SOLN
INTRAMUSCULAR | Status: AC
  Filled 2020-05-16: qty 30

## 2020-05-16 MED ORDER — PROPOFOL 500 MG/50ML IV EMUL
INTRAVENOUS | Status: DC | PRN
  Administered 2020-05-16: 150 ug/kg/min via INTRAVENOUS

## 2020-05-16 MED ORDER — DARBEPOETIN ALFA 40 MCG/0.4ML IJ SOSY
40.0000 ug | PREFILLED_SYRINGE | INTRAMUSCULAR | Status: DC
  Administered 2020-05-16: 40 ug via INTRAVENOUS
  Filled 2020-05-16 (×2): qty 0.4

## 2020-05-16 MED ORDER — PROCHLORPERAZINE EDISYLATE 10 MG/2ML IJ SOLN: 10.0000 mg | Freq: Four times a day (QID) | INTRAMUSCULAR | Status: DC | PRN

## 2020-05-16 MED ORDER — HYDRALAZINE HCL 20 MG/ML IJ SOLN: 10.0000 mg | Freq: Four times a day (QID) | INTRAMUSCULAR | Status: DC | PRN

## 2020-05-16 MED ORDER — SODIUM CHLORIDE 0.9 % IV SOLN: INTRAVENOUS | Status: DC

## 2020-05-16 MED ORDER — LACTATED RINGERS IV SOLN: INTRAVENOUS | Status: DC | PRN

## 2020-05-16 MED ORDER — CHLORHEXIDINE GLUCONATE 0.12 % MT SOLN: 15.0000 mL | Freq: Once | OROMUCOSAL | Status: DC

## 2020-05-16 MED ORDER — METOPROLOL TARTRATE 5 MG/5ML IV SOLN
2.5000 mg | Freq: Once | INTRAVENOUS | Status: AC
  Administered 2020-05-16: 2.5 mg via INTRAVENOUS
  Filled 2020-05-16: qty 5

## 2020-05-16 MED ORDER — METOPROLOL TARTRATE 5 MG/5ML IV SOLN
INTRAVENOUS | Status: DC | PRN
  Administered 2020-05-16 (×2): 2 mg via INTRAVENOUS

## 2020-05-16 MED ORDER — CHLORHEXIDINE GLUCONATE CLOTH 2 % EX PADS
6.0000 | MEDICATED_PAD | Freq: Once | CUTANEOUS | Status: AC
  Administered 2020-05-16: 6 via TOPICAL

## 2020-05-16 MED ORDER — HEPARIN SODIUM (PORCINE) 1000 UNIT/ML IJ SOLN
INTRAMUSCULAR | Status: AC
  Filled 2020-05-16: qty 4

## 2020-05-16 MED ORDER — HEPARIN (PORCINE) 25000 UT/250ML-% IV SOLN
750.0000 [IU]/h | INTRAVENOUS | Status: DC
  Administered 2020-05-16 – 2020-05-18 (×2): 750 [IU]/h via INTRAVENOUS
  Filled 2020-05-16 (×3): qty 250

## 2020-05-16 MED ORDER — PROPOFOL 10 MG/ML IV BOLUS
INTRAVENOUS | Status: AC
  Filled 2020-05-16: qty 40

## 2020-05-16 MED ORDER — ONDANSETRON HCL 4 MG/2ML IJ SOLN: 4.0000 mg | Freq: Once | INTRAMUSCULAR | Status: DC | PRN

## 2020-05-16 MED ORDER — AMLODIPINE BESYLATE 5 MG PO TABS
10.0000 mg | ORAL_TABLET | Freq: Every day | ORAL | Status: DC
  Administered 2020-05-16: 10 mg via ORAL
  Filled 2020-05-16: qty 2

## 2020-05-16 MED ORDER — FENTANYL CITRATE (PF) 100 MCG/2ML IJ SOLN
INTRAMUSCULAR | Status: AC
  Filled 2020-05-16: qty 2

## 2020-05-16 MED ORDER — SODIUM CHLORIDE (PF) 0.9 % IJ SOLN
INTRAMUSCULAR | Status: DC | PRN
  Administered 2020-05-16: 10 mL via INTRAVENOUS

## 2020-05-16 MED ORDER — KETAMINE HCL 50 MG/5ML IJ SOSY
PREFILLED_SYRINGE | INTRAMUSCULAR | Status: AC
  Filled 2020-05-16: qty 5

## 2020-05-16 MED ORDER — CEFAZOLIN SODIUM-DEXTROSE 2-4 GM/100ML-% IV SOLN
2.0000 g | INTRAVENOUS | Status: AC
  Administered 2020-05-16: 2 g via INTRAVENOUS
  Filled 2020-05-16: qty 100

## 2020-05-16 MED ORDER — NIFEDIPINE ER OSMOTIC RELEASE 30 MG PO TB24
90.0000 mg | ORAL_TABLET | Freq: Every day | ORAL | Status: DC
  Administered 2020-05-16 – 2020-05-18 (×3): 90 mg via ORAL
  Filled 2020-05-16 (×4): qty 3

## 2020-05-16 MED ORDER — PROPOFOL 10 MG/ML IV BOLUS
INTRAVENOUS | Status: DC | PRN
Start: 2020-05-16 — End: 2020-05-16
  Administered 2020-05-16: 2 mg via INTRAVENOUS

## 2020-05-16 MED ORDER — FENTANYL CITRATE (PF) 100 MCG/2ML IJ SOLN
25.0000 ug | INTRAMUSCULAR | Status: DC | PRN
Start: 2020-05-16 — End: 2020-05-16

## 2020-05-16 MED ORDER — HYDROMORPHONE HCL 1 MG/ML IJ SOLN: 0.5000 mg | INTRAMUSCULAR | Status: DC | PRN

## 2020-05-16 SURGICAL SUPPLY — 44 items
APPLICATOR CHLORAPREP 10.5 ORG (MISCELLANEOUS) ×2 IMPLANT
BAG DECANTER FOR FLEXI CONT (MISCELLANEOUS) ×2 IMPLANT
BIOPATCH BLUE 3/4IN DISK W/1.5 (GAUZE/BANDAGES/DRESSINGS) ×2 IMPLANT
BIOPATCH RED 1 DISK 7.0 (GAUZE/BANDAGES/DRESSINGS) ×2 IMPLANT
CATH PALINDROME-P 23CM W/VT (CATHETERS) ×2 IMPLANT
COVER LIGHT HANDLE STERIS (MISCELLANEOUS) ×4 IMPLANT
COVER PROBE U/S 5X48 (MISCELLANEOUS) ×2 IMPLANT
COVER WAND RF STERILE (DRAPES) ×2 IMPLANT
DECANTER SPIKE VIAL GLASS SM (MISCELLANEOUS) ×4 IMPLANT
DERMABOND ADVANCED (GAUZE/BANDAGES/DRESSINGS) ×1
DERMABOND ADVANCED .7 DNX12 (GAUZE/BANDAGES/DRESSINGS) ×1 IMPLANT
DRAPE C-ARM FOLDED MOBILE STRL (DRAPES) ×2 IMPLANT
DRAPE CHEST BREAST 15X10 FENES (DRAPES) ×2 IMPLANT
DRSG SORBAVIEW 3.5X5-5/16 MED (GAUZE/BANDAGES/DRESSINGS) ×2 IMPLANT
ELECT REM PT RETURN 9FT ADLT (ELECTROSURGICAL) ×2
ELECTRODE REM PT RTRN 9FT ADLT (ELECTROSURGICAL) ×1 IMPLANT
GAUZE 4X4 16PLY RFD (DISPOSABLE) ×2 IMPLANT
GEL ULTRASOUND 20GR AQUASONIC (MISCELLANEOUS) ×2 IMPLANT
GLOVE BIO SURGEON STRL SZ 6.5 (GLOVE) ×2 IMPLANT
GLOVE BIOGEL PI IND STRL 6.5 (GLOVE) ×1 IMPLANT
GLOVE BIOGEL PI IND STRL 7.0 (GLOVE) ×2 IMPLANT
GLOVE BIOGEL PI INDICATOR 6.5 (GLOVE) ×1
GLOVE BIOGEL PI INDICATOR 7.0 (GLOVE) ×2
GOWN STRL REUS W/TWL LRG LVL3 (GOWN DISPOSABLE) ×4 IMPLANT
IV CONNECTOR ONE LINK NDLESS (IV SETS) IMPLANT
IV NS 500ML (IV SOLUTION) ×1
IV NS 500ML BAXH (IV SOLUTION) ×1 IMPLANT
KIT BLADEGUARD II DBL (SET/KITS/TRAYS/PACK) ×2 IMPLANT
KIT PALINDROME-P 55CM (CATHETERS) IMPLANT
KIT TURNOVER KIT A (KITS) ×2 IMPLANT
MARKER SKIN DUAL TIP RULER LAB (MISCELLANEOUS) ×2 IMPLANT
NEEDLE HYPO 18GX1.5 BLUNT FILL (NEEDLE) ×2 IMPLANT
NEEDLE HYPO 25X1 1.5 SAFETY (NEEDLE) ×2 IMPLANT
PACK BASIC III (CUSTOM PROCEDURE TRAY) ×1
PACK SRG BSC III STRL LF ECLPS (CUSTOM PROCEDURE TRAY) ×1 IMPLANT
PAD ARMBOARD 7.5X6 YLW CONV (MISCELLANEOUS) ×2 IMPLANT
PENCIL SMOKE EVACUATOR COATED (MISCELLANEOUS) ×2 IMPLANT
SET BASIN LINEN APH (SET/KITS/TRAYS/PACK) ×2 IMPLANT
SUT MNCRL AB 4-0 PS2 18 (SUTURE) ×2 IMPLANT
SUT SILK 2 0 FSL 18 (SUTURE) ×2 IMPLANT
SUT VIC AB 3-0 SH 27 (SUTURE) ×1
SUT VIC AB 3-0 SH 27X BRD (SUTURE) ×1 IMPLANT
SYR 10ML LL (SYRINGE) ×4 IMPLANT
SYR CONTROL 10ML LL (SYRINGE) ×2 IMPLANT

## 2020-05-16 NOTE — Procedures (Signed)
   HEMODIALYSIS TREATMENT NOTE (HD#3):  New RIJ TDC was placed this afternoon by Dr. Constance Haw and we are grateful she was able to work the patient into her busy schedule.  Catheter tolerated prescribed flows with stable pressures.  Pt unable to tolerate UF of 2-3L as ordered.  She became hypotensive after a mere 300cc was removed.  Appears euvolemic on assessment.  UF was suspended for the majority of the treatment with SBPs ranging from 85-116.  Net UF +424 cc.   Rockwell Alexandria, RN

## 2020-05-16 NOTE — Progress Notes (Addendum)
[facility]  Updated Mabel that surgery completed. Femoral line out. Diet ok. CXR pending. Femoral dialysis line out and no hematoma.   Dilaudid PRN for pain. Heparin gtt can restart 930pm   Curlene Labrum, MD [facility] [address] [place], [place] [address] [phone] (office)

## 2020-05-16 NOTE — Progress Notes (Addendum)
Nephrology Follow-Up Consult note   Assessment/Recommendations: Rose Freeman is a/an 80 y.o. female with a past medical history significant for CHF, HTN, hypothyroidism, CAD s/p CABG, and CKD V, admitted for AKI on CKD V and COVID pneumonia.     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  Will need to transition to PD after being established on outpatient hemodialysis. -Femoral catheter with poor flow rates -Greatly appreciate surgical consult and assistance with [facility] placement -Plan for dialysis again today or tomorrow pending TDC placement -CLIP process next week -Dose medications based on GFR of <15 -Monitor Daily I/Os, Daily weight   COVID pna: bilateral infiltrates covid positive. Unvaccinated. Fortunately no O2 requirement. Treatment per primary  Hypertension: BP markedly elevated. Holding dilt and BB due to bradycardia. On amlodipine and imdur. Likely will improve with fluid removal on dialysis. Consider oral hydral if needed  AMS/Dementia: may be at baseline. Acute illness and uremia contributingg  Secondary hyperparathyroidism: Phos 8.5. Likely will improve with HD. Home sensipar  Anemia 2/2 CKD: Hgb decreased to 9. Iron sat 33 and ferritin 801. Start aranesp 71mcg weekly.   Recommendations conveyed to primary service.    [facility] 05/16/2020 10:27 AM  ___________________________________________________________  CC: AKI on CKD V, uremia, hyperkalemia  Interval History/Subjective: Patient underwent dialysis yesterday evening.  Ultrafiltration was limited by hypotension.  She had about 1 L of ultrafiltration.  She also had low blood flow rates despite troubleshooting.  Patient evaluated by surgery with plans to perform [facility] today.  Otherwise patient fairly stable.  Blood pressure remains elevated most of the day.  Not requiring oxygen.  Remains confused but has  dementia at baseline.  Patient says she has dyspnea on exertion but denies any other complaints.  Medications:  Current Facility-Administered Medications  Medication Dose Route Frequency Provider Last Rate Last Admin  . 0.9 %  sodium chloride infusion  100 mL Intravenous PRN Dwana Melena, MD      . 0.9 %  sodium chloride infusion  100 mL Intravenous PRN Dwana Melena, MD      . alteplase (CATHFLO ACTIVASE) injection 2 mg  2 mg Intracatheter Once PRN Dwana Melena, MD      . amLODipine (NORVASC) tablet 10 mg  10 mg Oral Daily Barton Dubois, MD   10 mg at 05/16/20 4481  . Chlorhexidine Gluconate Cloth 2 % PADS 6 each  6 each Topical Q0600 Dwana Melena, MD      . cinacalcet [facility]) tablet 30 mg  30 mg Oral Q breakfast Reesa Chew, MD      . exemestane (AROMASIN) tablet 25 mg  25 mg Oral QPC breakfast Barton Dubois, MD   25 mg at 05/15/20 8563  . heparin injection 1,000 Units  1,000 Units Dialysis PRN Dwana Melena, MD      . heparin injection 1,000 Units  1,000 Units Dialysis PRN Dwana Melena, MD      . hydrALAZINE (APRESOLINE) injection 5 mg  5 mg Intravenous Q6H PRN Elgergawy, Silver Huguenin, MD   5 mg at 05/14/20 2122  . isosorbide mononitrate (IMDUR) 24 hr tablet 60 mg  60 mg Oral Daily Elgergawy, Silver Huguenin, MD   60 mg at 05/16/20 0836  . methylPREDNISolone sodium succinate (SOLU-MEDROL) 125 mg/2 mL injection 75 mg  1 mg/kg Intravenous Q12H Elgergawy, Silver Huguenin, MD   75 mg at 05/16/20 [phone]  Followed by  . [START ON 05/17/2020] predniSONE (DELTASONE) tablet 50 mg  50 mg Oral Daily Elgergawy, Silver Huguenin, MD      . metoprolol tartrate (LOPRESSOR) injection 5 mg  5 mg Intravenous Q6H PRN Zierle-Ghosh, Asia B, DO   5 mg at 05/15/20 0211  . mirtazapine (REMERON) tablet 7.5 mg  7.5 mg Oral QHS Barton Dubois, MD   7.5 mg at 05/15/20 2224  . pantoprazole (PROTONIX) EC tablet 40 mg  40 mg Oral Daily Elgergawy, Silver Huguenin, MD   40 mg at 05/16/20 0835  . remdesivir 100 mg in sodium chloride 0.9 % 100 mL IVPB   100 mg Intravenous Daily Pham, Minh Q, RPH-CPP 10 mL/hr at 05/16/20 1024 Infusion Verify at 05/16/20 1024       Physical Exam: Vitals:   05/16/20 1000 05/16/20 1015  BP: (!) 186/146   Pulse:  69  Resp: 14 19  Temp:    SpO2:  97%   Total I/O In: 190.9 [P.O.:180; IV Piggyback:10.9] Out: -   Intake/Output Summary (Last 24 hours) at 05/16/2020 1027 Last data filed at 05/16/2020 1024 Gross per 24 hour  Intake 493.85 ml  Output 954 ml  Net -460.15 ml   Constitutional: Alert, sitting in bed, no distress ENMT: ears and nose without scars or lesions, dry mucous membranes CV: Tachycardia, irregularly irregular rhythm Respiratory: Bilateral chest rise without any increased work of breathing Gastrointestinal: soft, non-tender, no palpable masses or hernias Skin: no visible lesions or rashes Ext: right femoral temporary dialysis catheter in place Psych: Awake, alert, answers questions appropriately, difficulty hearing, oriented to person and place   Test Results I personally reviewed new and old clinical labs and radiology tests Lab Results  Component Value Date   NA 137 05/16/2020   K 4.0 05/16/2020   CL 99 05/16/2020   CO2 23 05/16/2020   BUN 86 (H) 05/16/2020   CREATININE 14.76 (H) 05/16/2020   CALCIUM 8.8 (L) 05/16/2020   ALBUMIN 2.5 (L) 05/16/2020   PHOS 8.5 (H) 05/14/2020

## 2020-05-16 NOTE — Op Note (Signed)
Operative Note 05/16/20   Preoperative Diagnosis: End Stage Renal Disease    Postoperative Diagnosis: Same   Procedure(s) Performed: Tunneled Dialysis Catheter Placement, Right Internal Jugular    Surgeon: Lanell Matar. Constance Haw, MD   Assistants: No qualified resident was available   Anesthesia: Monitored anesthesia care   Anesthesiologist: Dr. Charna Elizabeth    Specimens: None   Estimated Blood Loss: Minimal   Fluoroscopy time: 11 seconds   Blood Replacement: None    Complications: None    Operative Findings: Normal anatomy   Indications: Ms. Trevathan is a 80 yo with ESRD who had a temporary femoral line and needed a tunneled catheter for dialysis. I discussed risk of bleeding, infection, injury to vessels, pneumothorax with her daughter.  Procedure: The patient was brought into the operating room and monitored anesthesia was induced.   The right chest and neck was prepped and draped in the usual sterile fashion.  Preoperative antibiotics were given.   An Ultrasound was used to verify that the right internal jugular vein was patent.  One percent lidocaine was used for local anesthesia.  The patient was measured and a 23 cm Palindrome dual lumen dialysis catheter.  The needles advanced into the right internal jugular vein using the Seldinger technique without difficulty.  A guidewire was then advanced into the right atrium under fluoroscopic guidance.  Ectopia was not noted.  The wire was secured.  An incision was made over the right chest and the catheter was tunneled to the neck.  The ultrasound again confirmed the wire was going into the vein only. Dilators were used over the wire to dilate the track.  An introducer and peel-away sheath were placed over the guidewire. The catheter was then inserted through the peel-away sheath and the peel-away sheath was removed.  A spot film was performed to confirm the position.  The catheter drew back and flushed easily. The lumens were packed with  heparin. Hemostats were used to position the catheter in the neck incision. The neck incision was closed with 4-0 Monocryl and Dermabond. The catheter was secured with 2-0 silk suture and a sterile Biopatch and dressing was applied.  Hemostasis was confirmed.     All tape and needle counts were correct at the end of the procedure. The right femoral dialysis catheter was removed and pressure was held for 15 minutes. No hematoma was noted. A chest x-ray will be performed and the patient was recovered in the OR due to her COVID infection.  Curlene Labrum, MD [facility] [address] [place], [place] [address] [phone] (office)

## 2020-05-16 NOTE — Progress Notes (Signed)
ANTICOAGULATION CONSULT NOTE  Pharmacy Consult for Heparin Indication: atrial fibrillation  No Known Allergies  Patient Measurements: Height: 5\' 3"  (160 cm) Weight: 63.3 kg (139 lb 8.8 oz) IBW/kg (Calculated) : 52.4 Heparin Dosing Weight: 68 kg  Vital Signs: Temp: 96.5 F (35.8 C) (02/04 1515) Temp Source: Oral (02/04 1158) BP: 149/91 (02/04 1515) Pulse Rate: 94 (02/04 1515)  Labs: Recent Labs    05/14/20 1425 05/14/20 1807 05/15/20 0030 05/15/20 0500 05/15/20 1021 05/15/20 2055 05/16/20 0318  HGB 9.7*  --   --  9.7*  --   --  8.9*  HCT 27.0*  --   --  27.3*  --   --  25.8*  PLT 353  --   --  280  --   --  210  HEPARINUNFRC  --   --   --   --  0.74* 1.35*  --   CREATININE 27.96* 27.78*  --  20.04*  --   --  14.76*  TROPONINIHS  --   --  60* 63*  --   --   --     Estimated Creatinine Clearance: 2.9 mL/min (A) (by C-G formula based on SCr of 14.76 mg/dL (H)).   Medical History: Past Medical History:  Diagnosis Date  . Cancer ([place])   . CHF (congestive heart failure) ([place])   . Chronic kidney disease   . Hypertension     Assessment: 80 yr old female presented with COVID. Noted pt with AKI requiring emergent HD. To begin heparin for afib. No anticoagulants PTA. D-dimer 8.96>10.56, Hgb 9.7, Hct 27.3, platelets 280.  2/3 PM Heparin level ~10 hrs after heparin infusion was decreased to 850 units/hr was 1.35 units/ml, which is above the goal range for this pt. The HL was drawn by the HD RN; it's possible that the heparin level could have been affected by heparin used for HD.  2/4 Heparin was  stopped at midnight 2/3 for surgery 2/3(TDC). Surgeon has instructed [place] to restart heparin at 2130.   Goals: Heparin level: 0.3-0.7 units/ml Monitor platelets by anticoagulation protocol: Yes   Plan:  Restart heparin at 2130 this evening at 750 units/hr Will f/u heparin level in 8 hours  Daily heparin level and CBC Monitor for bleeding  Isac Sarna, BS Vena Austria,  BCPS Clinical Pharmacist Pager [phone] 05/16/2020 3:31 PM

## 2020-05-16 NOTE — Progress Notes (Signed)
PROGRESS NOTE    Rose Freeman  RUE:[id number] DOB: 18-Jan-1941 DOA: 05/14/2020 PCP: Neale Burly, MD   Chief Complaint  Patient presents with  . Weakness    Brief admission Narrative:  Rose Freeman  is a 80 y.o. female, with past medical history of CKD stage V, anemia, unspecified, CHF unspecified, hypothyroidism, hypertension, CAD status post CABG, patient presents to ED secondary to cough and weakness, she is unvaccinated against Covid, she presents with cough for last 4 days, patient is very poor historian, it was obtained from daughter by phone, patient with stage V kidney disease, being planned for peritoneal dialysis, upon presentation to ED work-up significant for creatinine of 28, potassium of 7.3, BUN of 177, and she is COVID-19 positive, with chest x-ray for multifocal opacity, but no oxygen requirement, she has anemia at 9.7, afebrile, no leukocytosis, normal lactic acid and D-dimer elevated at 8.9, [facility] hospitalist consulted to admit.  Assessment & Plan: 1-acute kidney injury on chronic kidney disease stage V: with hyperkalemia -Emergently started on dialysis -potassium stable now; continue telemetry monitoring and follow trend -Continue to follow nephrology service recommendations for further dialysis treatment. -Maintain adequate hydration. -Adjust medications to appropriate Creatinine clearance. -TDC placement by surgery today; planning to initiate clipping process next week.  2-anemia of chronic kidney disease -Will follow nephrology service recommendations for epogen and IV iron -No overt BLEEDING appreciated -follow Hgb trend  3-hx of CAD -no CP currently -continue telemetry monitoring -continue Imdur  4-COVID-19 infection -family reported worsening SOB and hypoxia with ambulation -when able will check desaturation screening -continue tx with remdesivir and steroids -no fever -follow inflammatory markers.  5-HTN -anticipating HD will help with BP  control -continue current antihypertensive agents.   6-hyperphosphatemia -continue HD -continue sensipar   7-physical deconditioning  -will arrange for home health services at discharge -generalized weakness in the setting of COVID -19 infection and uremia.   8-chronic atrial fibrillation  -resume eliquis if nephrology servic ein agreement -currently on heparin drip -will resume procardia and continue PRN metoprolol  DVT prophylaxis: heparin  Code Status: Full code. Family Communication: no family at bedside. Disposition:   Status is: Inpatient  Dispo: The patient is from: home.              Anticipated d/c is to: home with home health services.              Anticipated d/c date is: 3-4 days              Patient currently no medically stable for discharge; continue to follow nephrology guidelines for further hemodialysis and anticipated tunneled catheter placement next week.  Continue treatment with steroids and remdesivir for COVID infection. Will transfer out of stepdown.   Difficult to place patient : no       Consultants:   Nephrology service  General surgery for dialysis catheter placement.   Procedures:  See below reports   Antimicrobials/antiviral -Remdesivir   Subjective: Good saturation on room air; no chest pain, no nausea, no vomiting. No bleeding appreciated. No labor breathing.  Objective: Vitals:   05/16/20 1515 05/16/20 1530 05/16/20 1545 05/16/20 1600  BP: (!) 149/91 (!) 156/88 (!) 147/87 (!) 144/85  Pulse: 94 92 86 80  Resp: 16 12 16 12   Temp: (!) 96.5 F (35.8 C)   (!) 97.5 F (36.4 C)  TempSrc:    Oral  SpO2: 95% 95% 94% 96%  Weight:      Height:  Intake/Output Summary (Last 24 hours) at 05/16/2020 1655 Last data filed at 05/16/2020 1630 Gross per 24 hour  Intake 897.66 ml  Output 959 ml  Net -61.34 ml   Filed Weights   05/14/20 2100 05/15/20 1645 05/15/20 1745  Weight: 74.8 kg 63.3 kg 63.3 kg    Examination: General  exam: Alert, awake, and following commands appropriately; no chest pain, no nausea, no vomiting.  No oxygen requirements at rest.  Patient is afebrile. Respiratory system: Bibasilar Rales, no wheezing, no using accessory muscle.  Positive rhonchi bilaterally also appreciated. Cardiovascular system:RRR. No murmurs, rubs, gallops. Gastrointestinal system: Abdomen is nondistended, soft and nontender. No organomegaly or masses felt. Normal bowel sounds heard. Central nervous system: No focal neurological deficits. Extremities: No cyanosis or clubbing. Skin: No rashes, no petechiae. Psychiatry: Mood & affect appropriate.    Data Reviewed: I have personally reviewed following labs and imaging studies  CBC: Recent Labs  Lab 05/14/20 1425 05/15/20 0500 05/16/20 0318  WBC 9.4 5.4 8.3  NEUTROABS 6.8 4.8 7.4  HGB 9.7* 9.7* 8.9*  HCT 27.0* 27.3* 25.8*  MCV 102.3* 98.2 98.1  PLT 353 280 124    Basic Metabolic Panel: Recent Labs  Lab 05/14/20 1422 05/14/20 1425 05/14/20 1807 05/15/20 0030 05/15/20 0500 05/16/20 0318  NA  --  139 139  --  138 137  K  --  7.3* 6.6* 4.7 4.7 4.0  CL  --  107 107  --  101 99  CO2  --  13* 15*  --  20* 23  GLUCOSE  --  79 119*  --  166* 149*  BUN  --  177* 177*  --  117* 86*  CREATININE  --  27.96* 27.78*  --  20.04* 14.76*  CALCIUM  --  9.0 9.0  --  8.6* 8.8*  PHOS 8.5*  --   --   --   --   --     GFR: Estimated Creatinine Clearance: 2.9 mL/min (A) (by C-G formula based on SCr of 14.76 mg/dL (H)).  Liver Function Tests: Recent Labs  Lab 05/14/20 1425 05/15/20 0500 05/16/20 0318  AST 20 18 18   ALT 10 9 9   ALKPHOS 74 69 57  BILITOT 0.9 0.7 0.7  PROT 8.0 7.1 6.7  ALBUMIN 3.1* 2.6* 2.5*    CBG: Recent Labs  Lab 05/16/20 0029 05/16/20 0320 05/16/20 0802 05/16/20 1156 05/16/20 1631  GLUCAP 143* 145* 132* 139* 171*     Recent Results (from the past 240 hour(s))  Blood Culture (routine x 2)     Status: None (Preliminary result)    Collection Time: 05/14/20  2:23 PM   Specimen: BLOOD  Result Value Ref Range Status   Specimen Description BLOOD LEFT WRIST  Final   Special Requests   Final    BOTTLES DRAWN AEROBIC AND ANAEROBIC Blood Culture adequate volume   Culture   Final    NO GROWTH 2 DAYS Performed at [facility], [address] [place], [place] [address]    Report Status PENDING  Incomplete  Blood Culture (routine x 2)     Status: None (Preliminary result)   Collection Time: 05/14/20  2:24 PM   Specimen: BLOOD  Result Value Ref Range Status   Specimen Description BLOOD LEFT HAND  Final   Special Requests   Final    BOTTLES DRAWN AEROBIC AND ANAEROBIC Blood Culture adequate volume   Culture   Final    NO GROWTH 2 DAYS Performed at [facility], [address], [place], [place] [address]    Report Status PENDING  Incomplete  SARS Coronavirus 2 by RT PCR (hospital order, performed in [facility] hospital lab) Nasopharyngeal Nasopharyngeal Swab     Status: Abnormal   Collection Time: 05/14/20  2:28 PM   Specimen: Nasopharyngeal Swab  Result Value Ref Range Status   SARS Coronavirus 2 POSITIVE (A) NEGATIVE Final    Comment: RESULT CALLED TO, READ BACK BY AND VERIFIED WITH: CRAWFORD,H RN @1540  05/14/20 BY JONES,T (NOTE) SARS-CoV-2 target nucleic acids are DETECTED  SARS-CoV-2 RNA is generally detectable in upper respiratory specimens  during the acute phase of infection.  Positive results are indicative  of the presence of the identified virus, but do not rule out bacterial infection or co-infection with other pathogens not detected by the test.  Clinical correlation with patient history and  other diagnostic information is necessary to determine patient infection status.  The expected result is negative.  Fact Sheet for Patients:   StrictlyIdeas.no   Fact Sheet for Healthcare Providers:   BankingDealers.co.za    This test is not yet approved or cleared by  the [place] FDA and  has been authorized for detection and/or diagnosis of SARS-CoV-2 by FDA under an Emergency Use Authorization (EUA).  This EUA will remain in effect (meaning this  test can be used) for the duration of  the COVID-19 declaration under Section 564(b)(1) of the Act, 21 U.S.C. section 360-bbb-3(b)(1), unless the authorization is terminated or revoked sooner.  Performed at [facility], [address] [place], [place] [address]   MRSA PCR Screening     Status: None   Collection Time: 05/15/20  4:45 PM   Specimen: Nasal Mucosa; Nasopharyngeal  Result Value Ref Range Status   MRSA by PCR NEGATIVE NEGATIVE Final    Comment:        The GeneXpert MRSA Assay (FDA approved for NASAL specimens only), is one component of a comprehensive MRSA colonization surveillance program. It is not intended to diagnose MRSA infection nor to guide or monitor treatment for MRSA infections. Performed at [facility], [address] [place], [place] [address]      Radiology Studies: US Venous Img Lower Bilateral (DVT)  Result Date: 05/15/2020 CLINICAL DATA:  Elevated D-dimer level. EXAM: BILATERAL LOWER EXTREMITY VENOUS DOPPLER ULTRASOUND TECHNIQUE: Gray-scale sonography with graded compression, as well as color Doppler and duplex ultrasound were performed to evaluate the lower extremity deep venous systems from the level of the common femoral vein and including the common femoral, femoral, profunda femoral, popliteal and calf veins including the posterior tibial, peroneal and gastrocnemius veins when visible. The superficial great saphenous vein was also interrogated. Spectral Doppler was utilized to evaluate flow at rest and with distal augmentation maneuvers in the common femoral, femoral and popliteal veins. COMPARISON:  None. FINDINGS: RIGHT LOWER EXTREMITY Common Femoral Vein: Not well visualized due to overlying bandages. Saphenofemoral Junction: Not well visualized due to overlying  bandages. Profunda Femoral Vein: No evidence of thrombus. Normal compressibility and flow on color Doppler imaging. Femoral Vein: No evidence of thrombus. Normal compressibility, respiratory phasicity and response to augmentation. Popliteal Vein: No evidence of thrombus. Normal compressibility, respiratory phasicity and response to augmentation. Calf Veins: No evidence of thrombus. Normal compressibility and flow on color Doppler imaging. Superficial Great Saphenous Vein: No evidence of thrombus. Normal compressibility. Venous Reflux:  None. Other Findings:  None. LEFT LOWER EXTREMITY Common Femoral Vein: Not well visualized due to overlying bandages. Saphenofemoral Junction:  Not well visualized due to overlying bandages. Profunda Femoral Vein: No evidence of thrombus. Normal compressibility and flow on color Doppler imaging. Femoral Vein: No evidence of thrombus. Normal compressibility, respiratory phasicity and response to augmentation. Popliteal Vein: No evidence of thrombus. Normal compressibility, respiratory phasicity and response to augmentation. Calf Veins: No evidence of thrombus. Normal compressibility and flow on color Doppler imaging. Superficial Great Saphenous Vein: No evidence of thrombus. Normal compressibility. Venous Reflux:  None. Other Findings:  None. IMPRESSION: No evidence of deep venous thrombosis in either lower extremity within the visualized venous structures. The common femoral and saphenofemoral junctions bilaterally are not well visualized due to overlying bandages. Electronically Signed   By: Marijo Conception M.D.   On: 05/15/2020 10:08   DG Chest Portable 1 View  Result Date: 05/16/2020 CLINICAL DATA:  80 year old female with dialysis catheter placement. EXAM: PORTABLE CHEST 1 VIEW COMPARISON:  Chest radiograph dated 05/14/2020. FINDINGS: Right-sided dialysis catheter with tip at the cavoatrial junction. There is mild cardiomegaly with mild vascular congestion. Overall improved  interstitial density seen on the prior radiograph. No focal consolidation, pleural effusion or pneumothorax. Atherosclerotic calcification of the aorta. Median sternotomy wires. No acute osseous pathology. IMPRESSION: 1. Dialysis catheter with tip at the cavoatrial junction. No pneumothorax. 2. Mild cardiomegaly with mild vascular congestion, improved since the prior radiograph. Electronically Signed   By: Anner Crete M.D.   On: 05/16/2020 15:56   DG C-Arm 1-60 Min-No Report  Result Date: 05/16/2020 Fluoroscopy was utilized by the requesting physician.  No radiographic interpretation.   Scheduled Meds: . amLODipine  10 mg Oral Daily  . Chlorhexidine Gluconate Cloth  6 each Topical Q0600  . cinacalcet  30 mg Oral Q breakfast  . darbepoetin (ARANESP) injection - DIALYSIS  40 mcg Intravenous Q Fri-HD  . exemestane  25 mg Oral QPC breakfast  . isosorbide mononitrate  60 mg Oral Daily  . methylPREDNISolone (SOLU-MEDROL) injection  1 mg/kg Intravenous Q12H   Followed by  . [START ON 05/17/2020] predniSONE  50 mg Oral Daily  . mirtazapine  7.5 mg Oral QHS  . pantoprazole  40 mg Oral Daily   Continuous Infusions: . sodium chloride    . sodium chloride    . heparin    . remdesivir 100 mg in NS 100 mL Stopped (05/16/20 1029)     LOS: 2 days    Time spent: 35 minutes  Barton Dubois, MD [facility]   To contact the attending provider between 7A-7P or the covering provider during after hours 7P-7A, please log into the web site [email or web address] and access using universal [place] password for that web site. If you do not have the password, please call the hospital operator.  05/16/2020, 4:55 PM

## 2020-05-16 NOTE — Care Management Important Message (Signed)
Important Message  Patient Details  Name: Rose Freeman MRN: [id number] Date of Birth: 1940/11/15   Medicare Important Message Given:  Yes - Important Message mailed due to current National Emergency     Tommy Medal 05/16/2020, 3:40 PM

## 2020-05-16 NOTE — Consult Note (Signed)
[facility] Consult  Reason for Consult: ESRD and COVID Referring Physician:  Dr. Dyann Kief   Chief Complaint    Weakness      HPI: Rose Freeman is a 80 y.o. female with ESRD who has been doing dialysis on a temporary femoral catheter and also has COVID. She was admitted to the hospital 2 nights ago and a I did an emergent femoral line.  She is going to need tunneled dialysis access in order to get dialysis at home.  She has a history of CHF, ESRD, HTN, and prior CABG.  She wants to do PD dialysis after she gets home and is stabilized with HD.  Past Medical History:  Diagnosis Date  . Cancer ([place])   . CHF (congestive heart failure) ([place])   . Chronic kidney disease   . Hypertension     Past Surgical History:  Procedure Laterality Date  . COLONOSCOPY    . CORONARY ARTERY BYPASS GRAFT      History reviewed. No pertinent family history.  Social History   Tobacco Use  . Smoking status: Former Research scientist (life sciences)  . Smokeless tobacco: Never Used  Substance Use Topics  . Alcohol use: Not Currently  . Drug use: Never    Medications:  I have reviewed the patient's current medications. Prior to Admission:  Medications Prior to Admission  Medication Sig Dispense Refill Last Dose  . albuterol (VENTOLIN HFA) 108 (90 Base) MCG/ACT inhaler Inhale 2 puffs into the lungs every 4 (four) hours as needed for wheezing or shortness of breath.   Past Week at Unknown time  . cinacalcet (SENSIPAR) 30 MG tablet Take 30 mg by mouth daily.   05/14/2020 at Unknown time  . exemestane (AROMASIN) 25 MG tablet Take 25 mg by mouth daily.   05/14/2020 at Unknown time  . FEROSUL 325 (65 Fe) MG tablet Take 325 mg by mouth 2 (two) times daily.   05/14/2020 at Unknown time  . furosemide (LASIX) 20 MG tablet Take 40 mg by mouth 2 (two) times daily. Take two (2) tablets twice daily   Past Week at Unknown time  . isosorbide mononitrate (IMDUR) 60 MG 24 hr tablet Take 60 mg by mouth daily.   05/14/2020 at  Unknown time  . mirtazapine (REMERON) 7.5 MG tablet Take 7.5 mg by mouth at bedtime.   05/13/2020 at Unknown time  . NIFEdipine (PROCARDIA XL/NIFEDICAL-XL) 90 MG 24 hr tablet Take 90 mg by mouth daily.   05/14/2020 at Unknown time   Scheduled: . amLODipine  10 mg Oral Daily  . Chlorhexidine Gluconate Cloth  6 each Topical Q0600  . cinacalcet  30 mg Oral Q breakfast  . exemestane  25 mg Oral QPC breakfast  . isosorbide mononitrate  60 mg Oral Daily  . methylPREDNISolone (SOLU-MEDROL) injection  1 mg/kg Intravenous Q12H   Followed by  . [START ON 05/17/2020] predniSONE  50 mg Oral Daily  . mirtazapine  7.5 mg Oral QHS  . pantoprazole  40 mg Oral Daily   Continuous: . sodium chloride    . sodium chloride    . remdesivir 100 mg in NS 100 mL 100 mg (05/16/20 0845)   OMA:YOKHTX chloride, sodium chloride, alteplase, heparin, heparin, hydrALAZINE, metoprolol tartrate  No Known Allergies   ROS:  Review of systems not obtained due to patient factors.  Blood pressure (!) 156/67, pulse (!) 103, temperature (!) 96.8 F (36 C), temperature source Axillary, resp. rate 12, height 5' 3"  (1.6 m), weight 63.3 kg,  SpO2 94 %. Physical Exam Vitals reviewed.  HENT:     Head: Atraumatic.     Nose: Nose normal.  Eyes:     Extraocular Movements: Extraocular movements intact.  Cardiovascular:     Rate and Rhythm: Normal rate.  Pulmonary:     Effort: Pulmonary effort is normal.  Abdominal:     General: There is no distension.     Palpations: Abdomen is soft.     Tenderness: There is no abdominal tenderness.  Genitourinary:    Comments: Femoral central line on left and dialysis catheter on right Skin:    General: Skin is warm.  Neurological:     Mental Status: She is alert. Mental status is at baseline. She is disoriented.     Results: Results for orders placed or performed during the hospital encounter of 05/14/20 (from the past 48 hour(s))  POC SARS Coronavirus 2 Ag-ED - Nasal Swab (BD  Veritor Kit)     Status: None   Collection Time: 05/14/20 12:51 PM  Result Value Ref Range   SARS Coronavirus 2 Ag NEGATIVE NEGATIVE    Comment: (NOTE) SARS-CoV-2 antigen NOT DETECTED.   Negative results are presumptive.  Negative results do not preclude SARS-CoV-2 infection and should not be used as the sole basis for treatment or other patient management decisions, including infection  control decisions, particularly in the presence of clinical signs and  symptoms consistent with COVID-19, or in those who have been in contact with the virus.  Negative results must be combined with clinical observations, patient history, and epidemiological information. The expected result is Negative.  Fact Sheet for Patients: HandmadeRecipes.com.cy  Fact Sheet for Healthcare Providers: FuneralLife.at  This test is not yet approved or cleared by the [place] FDA and  has been authorized for detection and/or diagnosis of SARS-CoV-2 by FDA under an Emergency Use Authorization (EUA).  This EUA will remain in effect (meaning this test can be used) for the duration of  the COV ID-19 declaration under Section 564(b)(1) of the Act, 21 U.S.C. section 360bbb-3(b)(1), unless the authorization is terminated or revoked sooner.    Phosphorus     Status: Abnormal   Collection Time: 05/14/20  2:22 PM  Result Value Ref Range   Phosphorus 8.5 (H) 2.5 - 4.6 mg/dL    Comment: Performed at [facility], [address] [place], [place] [address]  VITAMIN D 25 Hydroxy (Vit-D Deficiency, Fractures)     Status: Abnormal   Collection Time: 05/14/20  2:22 PM  Result Value Ref Range   Vit D, 25-Hydroxy 28.94 (L) 30 - 100 ng/mL    Comment: (NOTE) Vitamin D deficiency has been defined by the [place] practice guideline as a level of serum 25-OH  vitamin D less than 20 ng/mL (1,2). The Endocrine Society went on to  further define  vitamin D insufficiency as a level between 21 and 29  ng/mL (2).  1. IOM (Institute of Medicine). 2010. Dietary reference intakes for  calcium and D. [place]: The [employer]. 2. Holick MF, Binkley [place], Bischoff-Ferrari HA, et al. Evaluation,  treatment, and prevention of vitamin D deficiency: an Endocrine  Society clinical practice guideline, JCEM. 2011 Jul; 96(7): 1911-30.  Performed at [facility], [place] [address] [place], [place] [address]   Blood Culture (routine x 2)     Status: None (Preliminary result)   Collection Time: 05/14/20  2:23 PM   Specimen: BLOOD  Result  Value Ref Range   Specimen Description BLOOD LEFT WRIST    Special Requests      BOTTLES DRAWN AEROBIC AND ANAEROBIC Blood Culture adequate volume   Culture      NO GROWTH < 24 HOURS Performed at [facility], [address] [place], [place] [address]    Report Status PENDING   Blood Culture (routine x 2)     Status: None (Preliminary result)   Collection Time: 05/14/20  2:24 PM   Specimen: BLOOD  Result Value Ref Range   Specimen Description BLOOD LEFT HAND    Special Requests      BOTTLES DRAWN AEROBIC AND ANAEROBIC Blood Culture adequate volume   Culture      NO GROWTH < 24 HOURS Performed at [facility], [address], [place], [place] [address]    Report Status PENDING   Lactic acid, plasma     Status: None   Collection Time: 05/14/20  2:25 PM  Result Value Ref Range   Lactic Acid, Venous 1.0 0.5 - 1.9 mmol/L    Comment: Performed at [facility], [address], [place], [place] [address]  CBC WITH DIFFERENTIAL     Status: Abnormal   Collection Time: 05/14/20  2:25 PM  Result Value Ref Range   WBC 9.4 4.0 - 10.5 K/uL   RBC 2.64 (L) 3.87 - 5.11 MIL/uL   Hemoglobin 9.7 (L) 12.0 - 15.0 g/dL   HCT 27.0 (L) 36.0 - 46.0 %   MCV 102.3 (H) 80.0 - 100.0 fL   MCH 36.7 (H) 26.0 - 34.0 pg   MCHC 35.9 30.0 - 36.0 g/dL   RDW 14.9 11.5 - 15.5 %   Platelets 353 150 - 400 K/uL    nRBC 0.0 0.0 - 0.2 %   Neutrophils Relative % 72 %   Neutro Abs 6.8 1.7 - 7.7 K/uL   Lymphocytes Relative 20 %   Lymphs Abs 1.9 0.7 - 4.0 K/uL   Monocytes Relative 6 %   Monocytes Absolute 0.5 0.1 - 1.0 K/uL   Eosinophils Relative 1 %   Eosinophils Absolute 0.1 0.0 - 0.5 K/uL   Basophils Relative 0 %   Basophils Absolute 0.0 0.0 - 0.1 K/uL   Immature Granulocytes 1 %   Abs Immature Granulocytes 0.09 (H) 0.00 - 0.07 K/uL    Comment: Performed at [facility], [address] [place], [place] [address]  Comprehensive metabolic panel     Status: Abnormal   Collection Time: 05/14/20  2:25 PM  Result Value Ref Range   Sodium 139 135 - 145 mmol/L   Potassium 7.3 (HH) 3.5 - 5.1 mmol/L    Comment: CRITICAL RESULT CALLED TO, READ BACK BY AND VERIFIED WITH: HOLCOMB,R ON 05/14/20 AT 1455 BY LOY,C    Chloride 107 98 - 111 mmol/L   CO2 13 (L) 22 - 32 mmol/L   Glucose, Bld 79 70 - 99 mg/dL    Comment: Glucose reference range applies only to samples taken after fasting for at least 8 hours.   BUN 177 (H) 8 - 23 mg/dL    Comment: RESULTS CONFIRMED BY MANUAL DILUTION   Creatinine, Ser 27.96 (H) 0.44 - 1.00 mg/dL    Comment: RESULTS CONFIRMED BY MANUAL DILUTION   Calcium 9.0 8.9 - 10.3 mg/dL   Total Protein 8.0 6.5 - 8.1 g/dL   Albumin 3.1 (L) 3.5 - 5.0 g/dL   AST 20 15 - 41 U/L   ALT 10 0 - 44 U/L  Alkaline Phosphatase 74 38 - 126 U/L   Total Bilirubin 0.9 0.3 - 1.2 mg/dL   GFR, Estimated NOT CALCULATED >60 mL/min    Comment: (NOTE) Calculated using the CKD-EPI Creatinine Equation (2021)    Anion gap 19 (H) 5 - 15    Comment: Performed at [facility], [address], [place], [place] [address]  D-dimer, quantitative     Status: Abnormal   Collection Time: 05/14/20  2:25 PM  Result Value Ref Range   D-Dimer, Quant 8.96 (H) 0.00 - 0.50 ug/mL-FEU    Comment: (NOTE) At the manufacturer cut-off value of 0.5 g/mL FEU, this assay has a negative predictive value of 95-100%.This assay is  intended for use in conjunction with a clinical pretest probability (PTP) assessment model to exclude pulmonary embolism (PE) and deep venous thrombosis (DVT) in outpatients suspected of PE or DVT. Results should be correlated with clinical presentation. Performed at [facility], [address] [place], [place] [address]   Procalcitonin     Status: None   Collection Time: 05/14/20  2:25 PM  Result Value Ref Range   Procalcitonin 1.41 ng/mL    Comment:        Interpretation: PCT > 0.5 ng/mL and <= 2 ng/mL: Systemic infection (sepsis) is possible, but other conditions are known to elevate PCT as well. (NOTE)       Sepsis PCT Algorithm           Lower Respiratory Tract                                      Infection PCT Algorithm    ----------------------------     ----------------------------         PCT < 0.25 ng/mL                PCT < 0.10 ng/mL          Strongly encourage             Strongly discourage   discontinuation of antibiotics    initiation of antibiotics    ----------------------------     -----------------------------       PCT 0.25 - 0.50 ng/mL            PCT 0.10 - 0.25 ng/mL               OR       >80% decrease in PCT            Discourage initiation of                                            antibiotics      Encourage discontinuation           of antibiotics    ----------------------------     -----------------------------         PCT >= 0.50 ng/mL              PCT 0.26 - 0.50 ng/mL                AND       <80% decrease in PCT             Encourage initiation of  antibiotics       Encourage continuation           of antibiotics    ----------------------------     -----------------------------        PCT >= 0.50 ng/mL                  PCT > 0.50 ng/mL               AND         increase in PCT                  Strongly encourage                                      initiation of antibiotics    Strongly encourage  escalation           of antibiotics                                     -----------------------------                                           PCT <= 0.25 ng/mL                                                 OR                                        > 80% decrease in PCT                                      Discontinue / Do not initiate                                             antibiotics  Performed at [facility], [address], [place], [place] [address]   Lactate dehydrogenase     Status: Abnormal   Collection Time: 05/14/20  2:25 PM  Result Value Ref Range   LDH 294 (H) 98 - 192 U/L    Comment: Performed at [facility], [address], [place], [place] [address]  Fibrinogen     Status: Abnormal   Collection Time: 05/14/20  2:25 PM  Result Value Ref Range   Fibrinogen >800 (H) 210 - 475 mg/dL    Comment: Performed at [facility], [address] [place], [place] [address]  Triglycerides     Status: Abnormal   Collection Time: 05/14/20  2:26 PM  Result Value Ref Range   Triglycerides 160 (H) <150 mg/dL    Comment: Performed at [facility], [address], [place], [place] [address]  Ferritin     Status: Abnormal   Collection Time: 05/14/20  2:27 PM  Result Value Ref Range   Ferritin  691 (H) 11 - 307 ng/mL    Comment: Performed at [facility], [address], [place], [place] [address]  C-reactive protein     Status: Abnormal   Collection Time: 05/14/20  2:27 PM  Result Value Ref Range   CRP 11.2 (H) <1.0 mg/dL    Comment: Performed at [facility], [address] [place], [place] [address]  SARS Coronavirus 2 by RT PCR (hospital order, performed in [facility] hospital lab) Nasopharyngeal Nasopharyngeal Swab     Status: Abnormal   Collection Time: 05/14/20  2:28 PM   Specimen: Nasopharyngeal Swab  Result Value Ref Range   SARS Coronavirus 2 POSITIVE (A) NEGATIVE    Comment: RESULT CALLED TO, READ BACK BY AND VERIFIED WITH: CRAWFORD,H RN @1540  05/14/20 BY  JONES,T (NOTE) SARS-CoV-2 target nucleic acids are DETECTED  SARS-CoV-2 RNA is generally detectable in upper respiratory specimens  during the acute phase of infection.  Positive results are indicative  of the presence of the identified virus, but do not rule out bacterial infection or co-infection with other pathogens not detected by the test.  Clinical correlation with patient history and  other diagnostic information is necessary to determine patient infection status.  The expected result is negative.  Fact Sheet for Patients:   StrictlyIdeas.no   Fact Sheet for Healthcare Providers:   BankingDealers.co.za    This test is not yet approved or cleared by the [place] FDA and  has been authorized for detection and/or diagnosis of SARS-CoV-2 by FDA under an Emergency Use Authorization (EUA).  This EUA will remain in effect (meaning this  test can be used) for the duration of  the COVID-19 declaration under Section 564(b)(1) of the Act, 21 U.S.C. section 360-bbb-3(b)(1), unless the authorization is terminated or revoked sooner.  Performed at [facility], [address], [place], [place] [address]   Lactic acid, plasma     Status: None   Collection Time: 05/14/20  4:20 PM  Result Value Ref Range   Lactic Acid, Venous 0.9 0.5 - 1.9 mmol/L    Comment: Performed at [facility], [address], [place], [place] [address]  CBG monitoring, ED     Status: Abnormal   Collection Time: 05/14/20  4:52 PM  Result Value Ref Range   Glucose-Capillary 63 (L) 70 - 99 mg/dL    Comment: Glucose reference range applies only to samples taken after fasting for at least 8 hours.  Basic metabolic panel     Status: Abnormal   Collection Time: 05/14/20  6:07 PM  Result Value Ref Range   Sodium 139 135 - 145 mmol/L   Potassium 6.6 (HH) 3.5 - 5.1 mmol/L    Comment: CRITICAL RESULT CALLED TO, READ BACK BY AND VERIFIED WITH: DR.ELGERGAWY ON 05/14/20 AT  1955 BY LOY,C    Chloride 107 98 - 111 mmol/L   CO2 15 (L) 22 - 32 mmol/L   Glucose, Bld 119 (H) 70 - 99 mg/dL    Comment: Glucose reference range applies only to samples taken after fasting for at least 8 hours.   BUN 177 (H) 8 - 23 mg/dL    Comment: RESULTS CONFIRMED BY MANUAL DILUTION   Creatinine, Ser 27.78 (H) 0.44 - 1.00 mg/dL    Comment: RESULTS CONFIRMED BY MANUAL DILUTION   Calcium 9.0 8.9 - 10.3 mg/dL   GFR, Estimated NOT CALCULATED >60 mL/min    Comment: (NOTE) Calculated using the CKD-EPI Creatinine Equation (2021)    Anion gap 17 (H) 5 -  15    Comment: Performed at [facility], [address], [place], [place] [address]  CBG monitoring, ED     Status: None   Collection Time: 05/14/20  9:04 PM  Result Value Ref Range   Glucose-Capillary 76 70 - 99 mg/dL    Comment: Glucose reference range applies only to samples taken after fasting for at least 8 hours.  Potassium     Status: None   Collection Time: 05/15/20 12:30 AM  Result Value Ref Range   Potassium 4.7 3.5 - 5.1 mmol/L    Comment: DELTA CHECK NOTED Performed at [facility], [address], [place], [place] [address]   Troponin I (High Sensitivity)     Status: Abnormal   Collection Time: 05/15/20 12:30 AM  Result Value Ref Range   Troponin I (High Sensitivity) 60 (H) <18 ng/L    Comment: (NOTE) Elevated high sensitivity troponin I (hsTnI) values and significant  changes across serial measurements may suggest ACS but many other  chronic and acute conditions are known to elevate hsTnI results.  Refer to the "Links" section for chest pain algorithms and additional  guidance. Performed at [facility], [address], [place], [place] [address]   CBG monitoring, ED     Status: Abnormal   Collection Time: 05/15/20  2:10 AM  Result Value Ref Range   Glucose-Capillary 196 (H) 70 - 99 mg/dL    Comment: Glucose reference range applies only to samples taken after fasting for at least 8 hours.  CBC with  Differential/Platelet     Status: Abnormal   Collection Time: 05/15/20  5:00 AM  Result Value Ref Range   WBC 5.4 4.0 - 10.5 K/uL   RBC 2.78 (L) 3.87 - 5.11 MIL/uL   Hemoglobin 9.7 (L) 12.0 - 15.0 g/dL   HCT 27.3 (L) 36.0 - 46.0 %   MCV 98.2 80.0 - 100.0 fL   MCH 34.9 (H) 26.0 - 34.0 pg   MCHC 35.5 30.0 - 36.0 g/dL   RDW 14.8 11.5 - 15.5 %   Platelets 280 150 - 400 K/uL   nRBC 0.0 0.0 - 0.2 %   Neutrophils Relative % 89 %   Neutro Abs 4.8 1.7 - 7.7 K/uL   Lymphocytes Relative 9 %   Lymphs Abs 0.5 (L) 0.7 - 4.0 K/uL   Monocytes Relative 1 %   Monocytes Absolute 0.1 0.1 - 1.0 K/uL   Eosinophils Relative 0 %   Eosinophils Absolute 0.0 0.0 - 0.5 K/uL   Basophils Relative 0 %   Basophils Absolute 0.0 0.0 - 0.1 K/uL   WBC Morphology TOXIC GRANULATION    Immature Granulocytes 1 %   Abs Immature Granulocytes 0.06 0.00 - 0.07 K/uL   Reactive, Benign Lymphocytes PRESENT     Comment: Performed at [facility], [address], [place],  [address]  Comprehensive metabolic panel     Status: Abnormal   Collection Time: 05/15/20  5:00 AM  Result Value Ref Range   Sodium 138 135 - 145 mmol/L   Potassium 4.7 3.5 - 5.1 mmol/L   Chloride 101 98 - 111 mmol/L   CO2 20 (L) 22 - 32 mmol/L   Glucose, Bld 166 (H) 70 - 99 mg/dL    Comment: Glucose reference range applies only to samples taken after fasting for at least 8 hours.   BUN 117 (H) 8 - 23 mg/dL    Comment: RESULTS CONFIRMED BY MANUAL DILUTION   Creatinine, Ser 20.04 (H) 0.44 -  1.00 mg/dL   Calcium 8.6 (L) 8.9 - 10.3 mg/dL   Total Protein 7.1 6.5 - 8.1 g/dL   Albumin 2.6 (L) 3.5 - 5.0 g/dL   AST 18 15 - 41 U/L   ALT 9 0 - 44 U/L   Alkaline Phosphatase 69 38 - 126 U/L   Total Bilirubin 0.7 0.3 - 1.2 mg/dL   GFR, Estimated 2 (L) >60 mL/min    Comment: (NOTE) Calculated using the CKD-EPI Creatinine Equation (2021)    Anion gap 17 (H) 5 - 15    Comment: Performed at [facility], [address], [place], [place] [address]   D-dimer, quantitative (not at [facility])     Status: Abnormal   Collection Time: 05/15/20  5:00 AM  Result Value Ref Range   D-Dimer, Quant 10.56 (H) 0.00 - 0.50 ug/mL-FEU    Comment: (NOTE) At the manufacturer cut-off value of 0.5 g/mL FEU, this assay has a negative predictive value of 95-100%.This assay is intended for use in conjunction with a clinical pretest probability (PTP) assessment model to exclude pulmonary embolism (PE) and deep venous thrombosis (DVT) in outpatients suspected of PE or DVT. Results should be correlated with clinical presentation. Performed at [facility], [address] [place], [place] [address]   Troponin I (High Sensitivity)     Status: Abnormal   Collection Time: 05/15/20  5:00 AM  Result Value Ref Range   Troponin I (High Sensitivity) 63 (H) <18 ng/L    Comment: (NOTE) Elevated high sensitivity troponin I (hsTnI) values and significant  changes across serial measurements may suggest ACS but many other  chronic and acute conditions are known to elevate hsTnI results.  Refer to the "Links" section for chest pain algorithms and additional  guidance. Performed at [facility], [address] [place], [place] [address]   CBG monitoring, ED     Status: Abnormal   Collection Time: 05/15/20  8:21 AM  Result Value Ref Range   Glucose-Capillary 143 (H) 70 - 99 mg/dL    Comment: Glucose reference range applies only to samples taken after fasting for at least 8 hours.  Heparin level (unfractionated)     Status: Abnormal   Collection Time: 05/15/20 10:21 AM  Result Value Ref Range   Heparin Unfractionated 0.74 (H) 0.30 - 0.70 IU/mL    Comment: (NOTE) If heparin results are below expected values, and patient dosage has  been confirmed, suggest follow up testing of antithrombin III levels. Performed at [facility], [address] [place], [place] [address]   Iron and TIBC     Status: Abnormal   Collection Time: 05/15/20 10:21 AM  Result Value Ref Range    Iron 53 28 - 170 ug/dL   TIBC 160 (L) 250 - 450 ug/dL   Saturation Ratios 33 (H) 10.4 - 31.8 %   UIBC 107 ug/dL    Comment: Performed at [facility], [address] [place], [place] [address]  Ferritin     Status: Abnormal   Collection Time: 05/15/20 10:21 AM  Result Value Ref Range   Ferritin 801 (H) 11 - 307 ng/mL    Comment: Performed at [facility], [address] [place],  [address]  CBG monitoring, ED     Status: Abnormal   Collection Time: 05/15/20 12:05 PM  Result Value Ref Range   Glucose-Capillary 142 (H) 70 - 99 mg/dL    Comment: Glucose reference range applies only to samples taken after fasting for at least 8 hours.  Glucose, capillary     Status: Abnormal   Collection Time: 05/15/20  4:39 PM  Result Value Ref Range   Glucose-Capillary 136 (H) 70 - 99 mg/dL    Comment: Glucose reference range applies only to samples taken after fasting for at least 8 hours.  MRSA PCR Screening     Status: None   Collection Time: 05/15/20  4:45 PM   Specimen: Nasal Mucosa; Nasopharyngeal  Result Value Ref Range   MRSA by PCR NEGATIVE NEGATIVE    Comment:        The GeneXpert MRSA Assay (FDA approved for NASAL specimens only), is one component of a comprehensive MRSA colonization surveillance program. It is not intended to diagnose MRSA infection nor to guide or monitor treatment for MRSA infections. Performed at [facility], [address], [place], [place] [address]   Heparin level (unfractionated)     Status: Abnormal   Collection Time: 05/15/20  8:55 PM  Result Value Ref Range   Heparin Unfractionated 1.35 (H) 0.30 - 0.70 IU/mL    Comment: RESULTS CONFIRMED BY MANUAL DILUTION (NOTE) If heparin results are below expected values, and patient dosage has  been confirmed, suggest follow up testing of antithrombin III levels. Performed at [facility], [address] [place], [place] [address]   Glucose, capillary     Status: Abnormal   Collection Time: 05/15/20  9:02  PM  Result Value Ref Range   Glucose-Capillary 131 (H) 70 - 99 mg/dL    Comment: Glucose reference range applies only to samples taken after fasting for at least 8 hours.   Comment 1 Notify RN    Comment 2 Document in Chart   Glucose, capillary     Status: Abnormal   Collection Time: 05/16/20 12:29 AM  Result Value Ref Range   Glucose-Capillary 143 (H) 70 - 99 mg/dL    Comment: Glucose reference range applies only to samples taken after fasting for at least 8 hours.   Comment 1 Notify RN    Comment 2 Document in Chart   CBC with Differential/Platelet     Status: Abnormal   Collection Time: 05/16/20  3:18 AM  Result Value Ref Range   WBC 8.3 4.0 - 10.5 K/uL   RBC 2.63 (L) 3.87 - 5.11 MIL/uL   Hemoglobin 8.9 (L) 12.0 - 15.0 g/dL   HCT 25.8 (L) 36.0 - 46.0 %   MCV 98.1 80.0 - 100.0 fL   MCH 33.8 26.0 - 34.0 pg   MCHC 34.5 30.0 - 36.0 g/dL   RDW 14.2 11.5 - 15.5 %   Platelets 210 150 - 400 K/uL   nRBC 0.0 0.0 - 0.2 %   Neutrophils Relative % 89 %   Neutro Abs 7.4 1.7 - 7.7 K/uL   Lymphocytes Relative 9 %   Lymphs Abs 0.7 0.7 - 4.0 K/uL   Monocytes Relative 1 %   Monocytes Absolute 0.1 0.1 - 1.0 K/uL   Eosinophils Relative 0 %   Eosinophils Absolute 0.0 0.0 - 0.5 K/uL   Basophils Relative 0 %   Basophils Absolute 0.0 0.0 - 0.1 K/uL   WBC Morphology MILD LEFT SHIFT (1-5% METAS, OCC MYELO, OCC BANDS)     Comment: TOXIC GRANULATION   Immature Granulocytes 1 %   Abs Immature Granulocytes 0.05 0.00 - 0.07 K/uL    Comment: Performed at [facility], [address], [place], [place] [address]  Comprehensive metabolic panel     Status: Abnormal   Collection Time: 05/16/20  3:18 AM  Result Value Ref Range   Sodium 137 135 - 145 mmol/L   Potassium 4.0 3.5 - 5.1 mmol/L   Chloride 99 98 - 111 mmol/L   CO2 23 22 - 32 mmol/L   Glucose, Bld 149 (H) 70 - 99 mg/dL    Comment: Glucose reference range applies only to samples taken after fasting for at least 8 hours.   BUN 86 (H) 8 - 23 mg/dL    Creatinine, Ser 14.76 (H) 0.44 - 1.00 mg/dL   Calcium 8.8 (L) 8.9 - 10.3 mg/dL   Total Protein 6.7 6.5 - 8.1 g/dL   Albumin 2.5 (L) 3.5 - 5.0 g/dL   AST 18 15 - 41 U/L   ALT 9 0 - 44 U/L   Alkaline Phosphatase 57 38 - 126 U/L   Total Bilirubin 0.7 0.3 - 1.2 mg/dL   GFR, Estimated 2 (L) >60 mL/min    Comment: (NOTE) Calculated using the CKD-EPI Creatinine Equation (2021)    Anion gap 15 5 - 15    Comment: Performed at [facility], [address], [place], [place] [address]  D-dimer, quantitative (not at [facility])     Status: Abnormal   Collection Time: 05/16/20  3:18 AM  Result Value Ref Range   D-Dimer, Quant 7.20 (H) 0.00 - 0.50 ug/mL-FEU    Comment: (NOTE) At the manufacturer cut-off value of 0.5 g/mL FEU, this assay has a negative predictive value of 95-100%.This assay is intended for use in conjunction with a clinical pretest probability (PTP) assessment model to exclude pulmonary embolism (PE) and deep venous thrombosis (DVT) in outpatients suspected of PE or DVT. Results should be correlated with clinical presentation. Performed at [facility], [address] [place], [place] [address]   Glucose, capillary     Status: Abnormal   Collection Time: 05/16/20  3:20 AM  Result Value Ref Range   Glucose-Capillary 145 (H) 70 - 99 mg/dL    Comment: Glucose reference range applies only to samples taken after fasting for at least 8 hours.   Comment 1 Notify RN    Comment 2 Document in Chart   Glucose, capillary     Status: Abnormal   Collection Time: 05/16/20  8:02 AM  Result Value Ref Range   Glucose-Capillary 132 (H) 70 - 99 mg/dL    Comment: Glucose reference range applies only to samples taken after fasting for at least 8 hours.    US Venous Img Lower Bilateral (DVT)  Result Date: 05/15/2020 CLINICAL DATA:  Elevated D-dimer level. EXAM: BILATERAL LOWER EXTREMITY VENOUS DOPPLER ULTRASOUND TECHNIQUE: Gray-scale sonography with graded compression, as well as color Doppler and  duplex ultrasound were performed to evaluate the lower extremity deep venous systems from the level of the common femoral vein and including the common femoral, femoral, profunda femoral, popliteal and calf veins including the posterior tibial, peroneal and gastrocnemius veins when visible. The superficial great saphenous vein was also interrogated. Spectral Doppler was utilized to evaluate flow at rest and with distal augmentation maneuvers in the common femoral, femoral and popliteal veins. COMPARISON:  None. FINDINGS: RIGHT LOWER EXTREMITY Common Femoral Vein: Not well visualized due to overlying bandages. Saphenofemoral Junction: Not well visualized due to overlying bandages. Profunda Femoral Vein: No evidence of thrombus. Normal compressibility and flow on color Doppler imaging. Femoral Vein: No evidence of thrombus. Normal compressibility, respiratory phasicity and response to augmentation. Popliteal Vein: No evidence of thrombus. Normal compressibility, respiratory phasicity and response to augmentation. Calf Veins: No  evidence of thrombus. Normal compressibility and flow on color Doppler imaging. Superficial Great Saphenous Vein: No evidence of thrombus. Normal compressibility. Venous Reflux:  None. Other Findings:  None. LEFT LOWER EXTREMITY Common Femoral Vein: Not well visualized due to overlying bandages. Saphenofemoral Junction: Not well visualized due to overlying bandages. Profunda Femoral Vein: No evidence of thrombus. Normal compressibility and flow on color Doppler imaging. Femoral Vein: No evidence of thrombus. Normal compressibility, respiratory phasicity and response to augmentation. Popliteal Vein: No evidence of thrombus. Normal compressibility, respiratory phasicity and response to augmentation. Calf Veins: No evidence of thrombus. Normal compressibility and flow on color Doppler imaging. Superficial Great Saphenous Vein: No evidence of thrombus. Normal compressibility. Venous Reflux:  None.  Other Findings:  None. IMPRESSION: No evidence of deep venous thrombosis in either lower extremity within the visualized venous structures. The common femoral and saphenofemoral junctions bilaterally are not well visualized due to overlying bandages. Electronically Signed   By: Marijo Conception M.D.   On: 05/15/2020 10:08   DG Chest Port 1 View  Result Date: 05/14/2020 CLINICAL DATA:  Cough EXAM: PORTABLE CHEST 1 VIEW COMPARISON:  10/22/2019. FINDINGS: Mildly enlarged cardiac silhouette. Status post CABG and median sternotomy. Aortic atherosclerosis. New peripheral predominant interstitial opacities. Left basilar atelectasis. No visible pleural effusions or pneumothorax. IMPRESSION: New peripheral predominant interstitial opacities which could represent atypical infection or interstitial edema. Left basilar atelectasis. Electronically Signed   By: Margaretha Sheffield MD   On: 05/14/2020 13:01     Assessment & Plan:  Rose Freeman is a 80 y.o. female with ESRD who is going to need a dialysis catheter for home. Plans for a tunneled catheter today. Discussed with Rose Freeman her daughter risk of bleeding, infection, pneumothorax, and injury to vessel. She has given permission.  -She is NPO -Heparin gtt should be held from last night  -COVID positive, precautions will be taken   All questions were answered to the satisfaction of the family.     Virl Cagey 05/16/2020, 9:27 AM

## 2020-05-16 NOTE — Transfer of Care (Signed)
Immediate Anesthesia Transfer of Care Note  Patient: Rose Freeman  Procedure(s) Performed: INSERTION OF DIALYSIS CATHETER (Right Chest)  Patient Location:OR  Anesthesia Type:General  Level of Consciousness: awake, alert  and oriented  Airway & Oxygen Therapy: Patient Spontanous Breathing  Post-op Assessment: Report given to RN  Post vital signs: Reviewed and stable  Last Vitals:  Vitals Value Taken Time  BP    Temp    Pulse    Resp    SpO2      Last Pain:  Vitals:   05/16/20 1158  TempSrc: Oral  PainSc:          Complications: No complications documented.

## 2020-05-16 NOTE — Anesthesia Postprocedure Evaluation (Signed)
Anesthesia Post Note  Patient: Rose Freeman  Procedure(s) Performed: INSERTION OF DIALYSIS CATHETER (Right Chest)  Patient location: OR. Anesthesia Type: General Level of consciousness: awake and patient cooperative Pain management: pain level controlled Vital Signs Assessment: post-procedure vital signs reviewed and stable Respiratory status: spontaneous breathing Cardiovascular status: blood pressure returned to baseline and stable Postop Assessment: no apparent nausea or vomiting Anesthetic complications: no   No complications documented.   Last Vitals:  Vitals:   05/16/20 1300 05/16/20 1400  BP: (!) 150/90 (!) 196/115  Pulse: (!) 105 (!) 112  Resp: 13 18  Temp:    SpO2: 97% 95%    Last Pain:  Vitals:   05/16/20 1158  TempSrc: Oral  PainSc:                  Tressie Stalker

## 2020-05-16 NOTE — Anesthesia Preprocedure Evaluation (Addendum)
Anesthesia Evaluation  Patient identified by MRN, date of birth, ID band Patient awake    Reviewed: Allergy & Precautions, NPO status , Patient's Chart, lab work & pertinent test results  History of Anesthesia Complications Negative for: history of anesthetic complications  Airway Mallampati: II  TM Distance: >3 FB Neck ROM: Full    Dental  (+) Edentulous Upper, Edentulous Lower   Pulmonary pneumonia (COVID positive), former smoker,  New peripheral predominant interstitial opacities which could represent atypical infection or interstitial edema. Left basilar atelectasis.   Electronically Signed   By: Margaretha Sheffield MD   On: 05/14/2020 13:01   Pulmonary exam normal breath sounds clear to auscultation       Cardiovascular Exercise Tolerance: Poor hypertension, Pt. on medications + CAD, + CABG and +CHF  + dysrhythmias Atrial Fibrillation  Rhythm:Regular Rate:Tachycardia  16-May-2020 04:22:51 [facility]-ICU ROUTINE RECORD Sinus rhythm with marked sinus arrhythmia with 1st degree A-V block Left axis deviation Right bundle branch block Abnormal ECG   Neuro/Psych negative psych ROS   GI/Hepatic Neg liver ROS, GERD  Medicated,  Endo/Other  negative endocrine ROS  Renal/GU Dialysis, ARF and CRFRenal disease  negative genitourinary   Musculoskeletal negative musculoskeletal ROS (+)   Abdominal   Peds negative pediatric ROS (+)  Hematology  (+) Blood dyscrasia (on heparin infusion), ,   Anesthesia Other Findings   Reproductive/Obstetrics negative OB ROS                           Anesthesia Physical Anesthesia Plan  ASA: IV  Anesthesia Plan: General   Post-op Pain Management:    Induction: Intravenous  PONV Risk Score and Plan: TIVA  Airway Management Planned: Nasal Cannula and Natural Airway  Additional Equipment:   Intra-op Plan:   Post-operative Plan:    Informed Consent: I have reviewed the patients History and Physical, chart, labs and discussed the procedure including the risks, benefits and alternatives for the proposed anesthesia with the patient or authorized representative who has indicated his/her understanding and acceptance.     Consent reviewed with POA  Plan Discussed with: CRNA and Surgeon  Anesthesia Plan Comments:        Anesthesia Quick Evaluation

## 2020-05-17 LAB — CBC WITH DIFFERENTIAL/PLATELET
Abs Immature Granulocytes: 0.1 10*3/uL — ABNORMAL HIGH (ref 0.00–0.07)
Basophils Absolute: 0 10*3/uL (ref 0.0–0.1)
Basophils Relative: 0 %
Eosinophils Absolute: 0 10*3/uL (ref 0.0–0.5)
Eosinophils Relative: 0 %
HCT: 35.1 % — ABNORMAL LOW (ref 36.0–46.0)
Hemoglobin: 11.1 g/dL — ABNORMAL LOW (ref 12.0–15.0)
Immature Granulocytes: 1 %
Lymphocytes Relative: 6 %
Lymphs Abs: 0.6 10*3/uL — ABNORMAL LOW (ref 0.7–4.0)
MCH: 30.3 pg (ref 26.0–34.0)
MCHC: 31.6 g/dL (ref 30.0–36.0)
MCV: 95.9 fL (ref 80.0–100.0)
Monocytes Absolute: 0.4 10*3/uL (ref 0.1–1.0)
Monocytes Relative: 4 %
Neutro Abs: 9.2 10*3/uL — ABNORMAL HIGH (ref 1.7–7.7)
Neutrophils Relative %: 89 %
Platelets: 273 10*3/uL (ref 150–400)
RBC: 3.66 MIL/uL — ABNORMAL LOW (ref 3.87–5.11)
RDW: 13.9 % (ref 11.5–15.5)
WBC: 10.3 10*3/uL (ref 4.0–10.5)
nRBC: 0.3 % — ABNORMAL HIGH (ref 0.0–0.2)

## 2020-05-17 LAB — COMPREHENSIVE METABOLIC PANEL
ALT: 11 U/L (ref 0–44)
AST: 19 U/L (ref 15–41)
Albumin: 2.9 g/dL — ABNORMAL LOW (ref 3.5–5.0)
Alkaline Phosphatase: 63 U/L (ref 38–126)
Anion gap: 15 (ref 5–15)
BUN: 47 mg/dL — ABNORMAL HIGH (ref 8–23)
CO2: 23 mmol/L (ref 22–32)
Calcium: 9 mg/dL (ref 8.9–10.3)
Chloride: 96 mmol/L — ABNORMAL LOW (ref 98–111)
Creatinine, Ser: 8.56 mg/dL — ABNORMAL HIGH (ref 0.44–1.00)
GFR, Estimated: 4 mL/min — ABNORMAL LOW (ref >60)
Glucose, Bld: 126 mg/dL — ABNORMAL HIGH (ref 70–99)
Potassium: 3.8 mmol/L (ref 3.5–5.1)
Sodium: 134 mmol/L — ABNORMAL LOW (ref 135–145)
Total Bilirubin: 0.8 mg/dL (ref 0.3–1.2)
Total Protein: 7.2 g/dL (ref 6.5–8.1)

## 2020-05-17 LAB — GLUCOSE, CAPILLARY
Glucose-Capillary: 121 mg/dL — ABNORMAL HIGH (ref 70–99)
Glucose-Capillary: 127 mg/dL — ABNORMAL HIGH (ref 70–99)
Glucose-Capillary: 130 mg/dL — ABNORMAL HIGH (ref 70–99)
Glucose-Capillary: 139 mg/dL — ABNORMAL HIGH (ref 70–99)
Glucose-Capillary: 142 mg/dL — ABNORMAL HIGH (ref 70–99)
Glucose-Capillary: 172 mg/dL — ABNORMAL HIGH (ref 70–99)

## 2020-05-17 LAB — D-DIMER, QUANTITATIVE: D-Dimer, Quant: 3.19 ug/mL-FEU — ABNORMAL HIGH (ref 0.00–0.50)

## 2020-05-17 LAB — HEPARIN LEVEL (UNFRACTIONATED): Heparin Unfractionated: 0.47 IU/mL (ref 0.30–0.70)

## 2020-05-17 MED ORDER — NYSTATIN 100000 UNIT/ML MT SUSP
5.0000 mL | Freq: Four times a day (QID) | OROMUCOSAL | Status: DC
  Administered 2020-05-17 – 2020-05-21 (×18): 500000 [IU] via OROMUCOSAL
  Filled 2020-05-17 (×16): qty 5

## 2020-05-17 NOTE — Progress Notes (Addendum)
PROGRESS NOTE    Rose Freeman  OIN:[id number] DOB: 1940-07-07 DOA: 05/14/2020 PCP: Neale Burly, MD   Chief Complaint  Patient presents with  . Weakness    Brief admission Narrative:  Rose Freeman  is a 80 y.o. female, with past medical history of CKD stage V, anemia, unspecified, CHF unspecified, hypothyroidism, hypertension, CAD status post CABG, patient presents to ED secondary to cough and weakness, she is unvaccinated against Covid, she presents with cough for last 4 days, patient is very poor historian, it was obtained from daughter by phone, patient with stage V kidney disease, being planned for peritoneal dialysis, upon presentation to ED work-up significant for creatinine of 28, potassium of 7.3, BUN of 177, and she is COVID-19 positive, with chest x-ray for multifocal opacity, but no oxygen requirement, she has anemia at 9.7, afebrile, no leukocytosis, normal lactic acid and D-dimer elevated at 8.9, [facility] hospitalist consulted to admit.  Assessment & Plan: 1-acute kidney injury on chronic kidney disease stage V: with hyperkalemia -Emergently started on dialysis -potassium stable now; continue telemetry monitoring and follow trend -Continue to follow nephrology service recommendations for further dialysis treatment. -Maintain adequate hydration. -Adjust medications to appropriate Creatinine clearance. -TDC placed by surgery on 05/16/20; planning to initiate clipping process next week.  2-anemia of chronic kidney disease -Will follow nephrology service recommendations for epogen and IV iron -No overt BLEEDING appreciated -follow Hgb trend  3-hx of CAD -no CP currently -continue telemetry monitoring -continue Imdur  4-COVID-19 infection -family reported worsening SOB and hypoxia with ambulation -when able will check desaturation screening -continue tx with remdesivir and steroids -no fever -follow inflammatory markers.  5-HTN -anticipating HD will help with BP  control -continue current antihypertensive agents.   6-hyperphosphatemia -continue HD -continue sensipar   7-physical deconditioning  -will arrange for home health services at discharge -generalized weakness in the setting of COVID -19 infection and uremia.   8-chronic atrial fibrillation  -resume eliquis if nephrology service in agreement -currently on heparin drip -will resume procardia and continue PRN metoprolol  DVT prophylaxis: heparin  Code Status: Full code. Family Communication: no family at bedside. Disposition:   Status is: Inpatient  Dispo: The patient is from: home.              Anticipated d/c is to: home with home health services.              Anticipated d/c date is: 3-4 days              Patient currently no medically stable for discharge; continue to follow nephrology guidelines for further hemodialysis and anticipated tunneled catheter placement next week.  Continue treatment with steroids and remdesivir for COVID infection. Will follow clipping process and arrangements for outpatient HD.   Difficult to place patient : no    Consultants:   Nephrology service  General surgery for dialysis catheter placement.   Procedures:  See below reports   Antimicrobials/antiviral -Remdesivir   Subjective: Good saturation on room air; no chest pain, no nausea, no vomiting, no abdominal pain.  Patient expressed no difficulty swallowing and sore throat.  Objective: Vitals:   05/16/20 2345 05/17/20 0000 05/17/20 0200 05/17/20 0500  BP: 100/74 (!) 142/88 (!) 147/99 104/66  Pulse:  86 78 94  Resp: 18 20 20 18   Temp:  98 F (36.7 C) 98.1 F (36.7 C) (!) 97.3 F (36.3 C)  TempSrc:  Oral Oral   SpO2:  97% 98% 99%  Weight:  Height:        Intake/Output Summary (Last 24 hours) at 05/17/2020 1655 Last data filed at 05/17/2020 1542 Gross per 24 hour  Intake 120.27 ml  Output -424 ml  Net 544.27 ml   Filed Weights   05/14/20 2100 05/15/20 1645 05/15/20  1745  Weight: 74.8 kg 63.3 kg 63.3 kg    Examination: General exam: Alert, awake, oriented x 2; following commands appropriately and expressing feeling slightly better.  Patient expressed having some throat discomfort and mild difficulty swallowing.  No fever, no chest pain, no nausea, no vomiting, no shortness of breath. Respiratory system: Good air movement bilaterally; no wheezing, no crackles, no using accessory muscle. Cardiovascular system: Rate controlled, no rubs, no gallops, no JVD on exam. Gastrointestinal system: Abdomen is nondistended, soft and nontender. No organomegaly or masses felt. Normal bowel sounds heard. Central nervous system: Alert and oriented. No focal neurological deficits. Extremities: No cyanosis or clubbing. Skin: No petechiae. Psychiatry: Judgement and insight appear normal. Mood & affect appropriate.   Data Reviewed: I have personally reviewed following labs and imaging studies  CBC: Recent Labs  Lab 05/14/20 1425 05/15/20 0500 05/16/20 0318 05/17/20 0903  WBC 9.4 5.4 8.3 10.3  NEUTROABS 6.8 4.8 7.4 9.2*  HGB 9.7* 9.7* 8.9* 11.1*  HCT 27.0* 27.3* 25.8* 35.1*  MCV 102.3* 98.2 98.1 95.9  PLT 353 280 210 532    Basic Metabolic Panel: Recent Labs  Lab 05/14/20 1422 05/14/20 1425 05/14/20 1425 05/14/20 1807 05/15/20 0030 05/15/20 0500 05/16/20 0318 05/17/20 0903  NA  --  139  --  139  --  138 137 134*  K  --  7.3*   < > 6.6* 4.7 4.7 4.0 3.8  CL  --  107  --  107  --  101 99 96*  CO2  --  13*  --  15*  --  20* 23 23  GLUCOSE  --  79  --  119*  --  166* 149* 126*  BUN  --  177*  --  177*  --  117* 86* 47*  CREATININE  --  27.96*  --  27.78*  --  20.04* 14.76* 8.56*  CALCIUM  --  9.0  --  9.0  --  8.6* 8.8* 9.0  PHOS 8.5*  --   --   --   --   --   --   --    < > = values in this interval not displayed.    GFR: Estimated Creatinine Clearance: 4.9 mL/min (A) (by C-G formula based on SCr of 8.56 mg/dL (H)).  Liver Function Tests: Recent  Labs  Lab 05/14/20 1425 05/15/20 0500 05/16/20 0318 05/17/20 0903  AST 20 18 18 19   ALT 10 9 9 11   ALKPHOS 74 69 57 63  BILITOT 0.9 0.7 0.7 0.8  PROT 8.0 7.1 6.7 7.2  ALBUMIN 3.1* 2.6* 2.5* 2.9*    CBG: Recent Labs  Lab 05/16/20 2356 05/17/20 0404 05/17/20 0809 05/17/20 1214 05/17/20 1653  GLUCAP 124* 130* 121* 139* 127*     Recent Results (from the past 240 hour(s))  Blood Culture (routine x 2)     Status: None (Preliminary result)   Collection Time: 05/14/20  2:23 PM   Specimen: BLOOD  Result Value Ref Range Status   Specimen Description BLOOD LEFT WRIST  Final   Special Requests   Final    BOTTLES DRAWN AEROBIC AND ANAEROBIC Blood Culture adequate volume   Culture   Final  NO GROWTH 3 DAYS Performed at [facility], [address], [place], [place] [address]    Report Status PENDING  Incomplete  Blood Culture (routine x 2)     Status: None (Preliminary result)   Collection Time: 05/14/20  2:24 PM   Specimen: BLOOD  Result Value Ref Range Status   Specimen Description BLOOD LEFT HAND  Final   Special Requests   Final    BOTTLES DRAWN AEROBIC AND ANAEROBIC Blood Culture adequate volume   Culture   Final    NO GROWTH 3 DAYS Performed at [facility], [address] [place], [place] [address]    Report Status PENDING  Incomplete  SARS Coronavirus 2 by RT PCR (hospital order, performed in [place] hospital lab) Nasopharyngeal Nasopharyngeal Swab     Status: Abnormal   Collection Time: 05/14/20  2:28 PM   Specimen: Nasopharyngeal Swab  Result Value Ref Range Status   SARS Coronavirus 2 POSITIVE (A) NEGATIVE Final    Comment: RESULT CALLED TO, READ BACK BY AND VERIFIED WITH: CRAWFORD,H RN @1540  05/14/20 BY JONES,T (NOTE) SARS-CoV-2 target nucleic acids are DETECTED  SARS-CoV-2 RNA is generally detectable in upper respiratory specimens  during the acute phase of infection.  Positive results are indicative  of the presence of the identified virus, but do not  rule out bacterial infection or co-infection with other pathogens not detected by the test.  Clinical correlation with patient history and  other diagnostic information is necessary to determine patient infection status.  The expected result is negative.  Fact Sheet for Patients:   StrictlyIdeas.no   Fact Sheet for Healthcare Providers:   BankingDealers.co.za    This test is not yet approved or cleared by the [place] FDA and  has been authorized for detection and/or diagnosis of SARS-CoV-2 by FDA under an Emergency Use Authorization (EUA).  This EUA will remain in effect (meaning this  test can be used) for the duration of  the COVID-19 declaration under Section 564(b)(1) of the Act, 21 U.S.C. section 360-bbb-3(b)(1), unless the authorization is terminated or revoked sooner.  Performed at [facility], [address] [place], [place] [address]   MRSA PCR Screening     Status: None   Collection Time: 05/15/20  4:45 PM   Specimen: Nasal Mucosa; Nasopharyngeal  Result Value Ref Range Status   MRSA by PCR NEGATIVE NEGATIVE Final    Comment:        The GeneXpert MRSA Assay (FDA approved for NASAL specimens only), is one component of a comprehensive MRSA colonization surveillance program. It is not intended to diagnose MRSA infection nor to guide or monitor treatment for MRSA infections. Performed at [facility], [address], [place], [place] [address]      Radiology Studies: DG Chest Portable 1 View  Result Date: 05/16/2020 CLINICAL DATA:  80 year old female with dialysis catheter placement. EXAM: PORTABLE CHEST 1 VIEW COMPARISON:  Chest radiograph dated 05/14/2020. FINDINGS: Right-sided dialysis catheter with tip at the cavoatrial junction. There is mild cardiomegaly with mild vascular congestion. Overall improved interstitial density seen on the prior radiograph. No focal consolidation, pleural effusion or pneumothorax.  Atherosclerotic calcification of the aorta. Median sternotomy wires. No acute osseous pathology. IMPRESSION: 1. Dialysis catheter with tip at the cavoatrial junction. No pneumothorax. 2. Mild cardiomegaly with mild vascular congestion, improved since the prior radiograph. Electronically Signed   By: Anner Crete M.D.   On: 05/16/2020 15:56   DG C-Arm 1-60 Min-No Report  Result Date: 05/16/2020  Fluoroscopy was utilized by the requesting physician.  No radiographic interpretation.   Scheduled Meds: . Chlorhexidine Gluconate Cloth  6 each Topical Q0600  . cinacalcet  30 mg Oral Q breakfast  . darbepoetin (ARANESP) injection - DIALYSIS  40 mcg Intravenous Q Fri-HD  . exemestane  25 mg Oral QPC breakfast  . isosorbide mononitrate  60 mg Oral Daily  . mirtazapine  7.5 mg Oral QHS  . NIFEdipine  90 mg Oral Daily  . pantoprazole  40 mg Oral Daily  . predniSONE  50 mg Oral Daily   Continuous Infusions: . sodium chloride    . sodium chloride    . heparin 750 Units/hr (05/16/20 2201)  . remdesivir 100 mg in NS 100 mL 100 mg (05/17/20 1001)     LOS: 3 days    Time spent: 35 minutes  Barton Dubois, MD [facility]   To contact the attending provider between 7A-7P or the covering provider during after hours 7P-7A, please log into the web site [email or web address] and access using universal [place] password for that web site. If you do not have the password, please call the hospital operator.  05/17/2020, 4:55 PM

## 2020-05-17 NOTE — Progress Notes (Signed)
ANTICOAGULATION CONSULT NOTE  Pharmacy Consult for Heparin Indication: atrial fibrillation  No Known Allergies  Patient Measurements: Height: 5\' 3"  (160 cm) Weight: 63.3 kg (139 lb 8.8 oz) IBW/kg (Calculated) : 52.4 Heparin Dosing Weight: 68 kg  Vital Signs: Temp: 97.3 F (36.3 C) (02/05 0500) Temp Source: Oral (02/05 0200) BP: 104/66 (02/05 0500) Pulse Rate: 94 (02/05 0500)  Labs: Recent Labs    05/15/20 0030 05/15/20 0500 05/15/20 1021 05/15/20 2055 05/16/20 0318 05/17/20 0903  HGB  --  9.7*  --   --  8.9* 11.1*  HCT  --  27.3*  --   --  25.8* 35.1*  PLT  --  280  --   --  210 273  HEPARINUNFRC  --   --  0.74* 1.35*  --  0.47  CREATININE  --  20.04*  --   --  14.76* 8.56*  TROPONINIHS 60* 63*  --   --   --   --     Estimated Creatinine Clearance: 4.9 mL/min (A) (by C-G formula based on SCr of 8.56 mg/dL (H)).   Medical History: Past Medical History:  Diagnosis Date  . Cancer ([place])   . CHF (congestive heart failure) ([place])   . Chronic kidney disease   . Hypertension     Assessment: 80 yr old female presented with COVID. Noted pt with AKI requiring emergent HD. To begin heparin for afib. No anticoagulants PTA. D-dimer 8.96>10.56, Hgb 9.7, Hct 27.3, platelets 280.  2/3 PM Heparin level ~10 hrs after heparin infusion was decreased to 850 units/hr was 1.35 units/ml, which is above the goal range for this pt. The HL was drawn by the HD RN; it's possible that the heparin level could have been affected by heparin used for HD.  2/4 Heparin was  stopped at midnight 2/3 for surgery 2/3(TDC). Surgeon has instructed [place] to restart heparin at 2130.   2.5 HL 0.47 therapeutic  Goals: Heparin level: 0.3-0.7 units/ml Monitor platelets by anticoagulation protocol: Yes   Plan:  Continue heparin at 750 units/hr Daily heparin level and CBC Monitor for bleeding  Isac Sarna, BS [place], BCPS Clinical Pharmacist Pager [phone] 05/17/2020 10:51 AM

## 2020-05-18 LAB — D-DIMER, QUANTITATIVE: D-Dimer, Quant: 8.14 ug/mL-FEU — ABNORMAL HIGH (ref 0.00–0.50)

## 2020-05-18 LAB — CBC WITH DIFFERENTIAL/PLATELET
Abs Immature Granulocytes: 0.17 10*3/uL — ABNORMAL HIGH (ref 0.00–0.07)
Basophils Absolute: 0 10*3/uL (ref 0.0–0.1)
Basophils Relative: 0 %
Eosinophils Absolute: 0 10*3/uL (ref 0.0–0.5)
Eosinophils Relative: 0 %
HCT: 31.9 % — ABNORMAL LOW (ref 36.0–46.0)
Hemoglobin: 9.8 g/dL — ABNORMAL LOW (ref 12.0–15.0)
Immature Granulocytes: 1 %
Lymphocytes Relative: 9 %
Lymphs Abs: 1.2 10*3/uL (ref 0.7–4.0)
MCH: 29.8 pg (ref 26.0–34.0)
MCHC: 30.7 g/dL (ref 30.0–36.0)
MCV: 97 fL (ref 80.0–100.0)
Monocytes Absolute: 0.7 10*3/uL (ref 0.1–1.0)
Monocytes Relative: 5 %
Neutro Abs: 10.8 10*3/uL — ABNORMAL HIGH (ref 1.7–7.7)
Neutrophils Relative %: 85 %
Platelets: 257 10*3/uL (ref 150–400)
RBC: 3.29 MIL/uL — ABNORMAL LOW (ref 3.87–5.11)
RDW: 13.8 % (ref 11.5–15.5)
WBC: 12.8 10*3/uL — ABNORMAL HIGH (ref 4.0–10.5)
nRBC: 0.9 % — ABNORMAL HIGH (ref 0.0–0.2)

## 2020-05-18 LAB — GLUCOSE, CAPILLARY
Glucose-Capillary: 152 mg/dL — ABNORMAL HIGH (ref 70–99)
Glucose-Capillary: 149 mg/dL — ABNORMAL HIGH (ref 70–99)
Glucose-Capillary: 169 mg/dL — ABNORMAL HIGH (ref 70–99)
Glucose-Capillary: 167 mg/dL — ABNORMAL HIGH (ref 70–99)
Glucose-Capillary: 151 mg/dL — ABNORMAL HIGH (ref 70–99)

## 2020-05-18 LAB — HEPARIN LEVEL (UNFRACTIONATED): Heparin Unfractionated: 0.44 IU/mL (ref 0.30–0.70)

## 2020-05-18 LAB — COMPREHENSIVE METABOLIC PANEL
ALT: 10 U/L (ref 0–44)
AST: 22 U/L (ref 15–41)
Albumin: 2.7 g/dL — ABNORMAL LOW (ref 3.5–5.0)
Alkaline Phosphatase: 55 U/L (ref 38–126)
Anion gap: 14 (ref 5–15)
BUN: 67 mg/dL — ABNORMAL HIGH (ref 8–23)
CO2: 23 mmol/L (ref 22–32)
Calcium: 8.3 mg/dL — ABNORMAL LOW (ref 8.9–10.3)
Chloride: 96 mmol/L — ABNORMAL LOW (ref 98–111)
Creatinine, Ser: 10.38 mg/dL — ABNORMAL HIGH (ref 0.44–1.00)
GFR, Estimated: 4 mL/min — ABNORMAL LOW (ref >60)
Glucose, Bld: 154 mg/dL — ABNORMAL HIGH (ref 70–99)
Potassium: 4.3 mmol/L (ref 3.5–5.1)
Sodium: 133 mmol/L — ABNORMAL LOW (ref 135–145)
Total Bilirubin: 0.7 mg/dL (ref 0.3–1.2)
Total Protein: 6.6 g/dL (ref 6.5–8.1)

## 2020-05-18 NOTE — Progress Notes (Signed)
PROGRESS NOTE    [name]  KCL:[id number] DOB: [birth year] DOA: 05/14/2020 PCP: Neale Burly, MD   Chief Complaint  Patient presents with  . Weakness    Brief admission Narrative:  Rose Freeman  is a 80 y.o. female, with past medical history of CKD stage V, anemia, unspecified, CHF unspecified, hypothyroidism, hypertension, CAD status post CABG, patient presents to ED secondary to cough and weakness, she is unvaccinated against Covid, she presents with cough for last 4 days, patient is very poor historian, it was obtained from daughter by phone, patient with stage V kidney disease, being planned for peritoneal dialysis, upon presentation to ED work-up significant for creatinine of 28, potassium of 7.3, BUN of 177, and she is COVID-19 positive, with chest x-ray for multifocal opacity, but no oxygen requirement, she has anemia at 9.7, afebrile, no leukocytosis, normal lactic acid and D-dimer elevated at 8.9, [facility] hospitalist consulted to admit.  Assessment & Plan: 1-acute kidney injury on chronic kidney disease stage V: with hyperkalemia -Emergently started on dialysis -potassium stable now; continue telemetry monitoring and follow trend -Continue to follow nephrology service recommendations for further dialysis treatment. -Continue to maintain adequate hydration. -Adjust medications to appropriate Creatinine clearance. -TDC placed by surgery on 05/16/20; planning to initiate clipping process next week. -anticipated next dialysis treatment on 05/19/2020.  2-anemia of chronic kidney disease -Will follow nephrology service recommendations for epogen and IV iron -No overt BLEEDING appreciated -Continue to follow Hgb trend  3-hx of CAD -no CP currently -continue telemetry monitoring -continue Imdur  4-COVID-19 infection -family reported worsening SOB and hypoxia with ambulation -when able will check desaturation screening; in case patient needs any oxygen supplementation with  activity. -continue tx with remdesivir and steroids -no fever -Continue to follow inflammatory markers.  5-HTN -anticipating HD will help with BP control -continue current antihypertensive agents.   6-hyperphosphatemia -continue HD -continue sensipar   7-physical deconditioning  -will arrange for home health services at discharge -generalized weakness in the setting of COVID -19 infection and uremia.   8-chronic atrial fibrillation  -resume eliquis if nephrology service in agreement -currently on heparin drip -will resume procardia and continue PRN metoprolol  9-thrush/esophageal candidiasis -Continue treatment with nystatin -Diet consistency adjusted to facilitate swallowing.  DVT prophylaxis: heparin  Code Status: Full code. Family Communication: no family at bedside. Disposition:   Status is: Inpatient  Dispo: The patient is from: home.              Anticipated d/c is to: home with home health services.              Anticipated d/c date is: 3-4 days              Patient currently no medically stable for discharge; continue to follow nephrology guidelines for further hemodialysis and anticipated tunneled catheter placement next week.  Continue treatment with steroids and remdesivir for COVID infection. Will follow clipping process and arrangements for outpatient HD.   Difficult to place patient : no    Consultants:   Nephrology service  General surgery for dialysis catheter placement.   Procedures:  See below reports   Antimicrobials/antiviral -Remdesivir   Subjective: Good saturation on room air, no chest pain, no nausea, no vomiting, no abdominal pain.  Still having some mild discomfort in her throat, in good spirit.  No overnight events.  Objective: Vitals:   05/17/20 0500 05/17/20 2125 05/18/20 0600 05/18/20 1139  BP: 104/66 (!) 87/56 92/61 (!) 113/93  Pulse:  94 91 78 88  Resp: 18 18 16 18   Temp: (!) 97.3 F (36.3 C) 98 F (36.7 C) 98.2 F (36.8  C) 98.3 F (36.8 C)  TempSrc:  Oral Oral Oral  SpO2: 99% 96% 94% 97%  Weight:      Height:        Intake/Output Summary (Last 24 hours) at 05/18/2020 1145 Last data filed at 05/17/2020 1542 Gross per 24 hour  Intake 61.08 ml  Output -  Net 61.08 ml   Filed Weights   05/14/20 2100 05/15/20 1645 05/15/20 1745  Weight: 74.8 kg 63.3 kg 63.3 kg    Examination: General exam: Alert, following commands appropriately and in no acute distress.  Patient reports no fever, no nausea, no vomiting, no chest pain.  Still with decreased appetite and having mild throat discomfort.  In good spirit.  No requiring oxygen supplementation. Respiratory system: Clear to auscultation. Respiratory effort normal.  No using accessory muscle.  Good saturation on room air. Cardiovascular system: Rate controlled, no rubs, no gallops, no JVD. Gastrointestinal system: Abdomen is nondistended, soft and nontender. No organomegaly or masses felt. Normal bowel sounds heard. Central nervous system: Alert and oriented. No focal neurological deficits. Extremities: No cyanosis or clubbing. Skin: No petechiae. Psychiatry: Mood & affect appropriate.   Data Reviewed: I have personally reviewed following labs and imaging studies  CBC: Recent Labs  Lab 05/14/20 1425 05/15/20 0500 05/16/20 0318 05/17/20 0903 05/18/20 0642  WBC 9.4 5.4 8.3 10.3 12.8*  NEUTROABS 6.8 4.8 7.4 9.2* 10.8*  HGB 9.7* 9.7* 8.9* 11.1* 9.8*  HCT 27.0* 27.3* 25.8* 35.1* 31.9*  MCV 102.3* 98.2 98.1 95.9 97.0  PLT 353 280 210 273 947    Basic Metabolic Panel: Recent Labs  Lab 05/14/20 1422 05/14/20 1425 05/14/20 1807 05/15/20 0030 05/15/20 0500 05/16/20 0318 05/17/20 0903 05/18/20 0642  NA  --    < > 139  --  138 137 134* 133*  K  --    < > 6.6* 4.7 4.7 4.0 3.8 4.3  CL  --    < > 107  --  101 99 96* 96*  CO2  --    < > 15*  --  20* 23 23 23   GLUCOSE  --    < > 119*  --  166* 149* 126* 154*  BUN  --    < > 177*  --  117* 86* 47* 67*   CREATININE  --    < > 27.78*  --  20.04* 14.76* 8.56* 10.38*  CALCIUM  --    < > 9.0  --  8.6* 8.8* 9.0 8.3*  PHOS 8.5*  --   --   --   --   --   --   --    < > = values in this interval not displayed.    GFR: Estimated Creatinine Clearance: 4.1 mL/min (A) (by C-G formula based on SCr of 10.38 mg/dL (H)).  Liver Function Tests: Recent Labs  Lab 05/14/20 1425 05/15/20 0500 05/16/20 0318 05/17/20 0903 05/18/20 0642  AST 20 18 18 19 22   ALT 10 9 9 11 10   ALKPHOS 74 69 57 63 55  BILITOT 0.9 0.7 0.7 0.8 0.7  PROT 8.0 7.1 6.7 7.2 6.6  ALBUMIN 3.1* 2.6* 2.5* 2.9* 2.7*    CBG: Recent Labs  Lab 05/17/20 2021 05/17/20 2347 05/18/20 0410 05/18/20 0742 05/18/20 1051  GLUCAP 142* 172* 152* 149* 169*     Recent Results (from  the past 240 hour(s))  Blood Culture (routine x 2)     Status: None (Preliminary result)   Collection Time: 05/14/20  2:23 PM   Specimen: BLOOD  Result Value Ref Range Status   Specimen Description BLOOD LEFT WRIST  Final   Special Requests   Final    BOTTLES DRAWN AEROBIC AND ANAEROBIC Blood Culture adequate volume   Culture   Final    NO GROWTH 3 DAYS Performed at [facility], [address], [place], [place] [address]    Report Status PENDING  Incomplete  Blood Culture (routine x 2)     Status: None (Preliminary result)   Collection Time: 05/14/20  2:24 PM   Specimen: BLOOD  Result Value Ref Range Status   Specimen Description BLOOD LEFT HAND  Final   Special Requests   Final    BOTTLES DRAWN AEROBIC AND ANAEROBIC Blood Culture adequate volume   Culture   Final    NO GROWTH 3 DAYS Performed at [facility], [address], [place], [place] [address]    Report Status PENDING  Incomplete  SARS Coronavirus 2 by RT PCR (hospital order, performed in [place] hospital lab) Nasopharyngeal Nasopharyngeal Swab     Status: Abnormal   Collection Time: 05/14/20  2:28 PM   Specimen: Nasopharyngeal Swab  Result Value Ref Range Status   SARS  Coronavirus 2 POSITIVE (A) NEGATIVE Final    Comment: RESULT CALLED TO, READ BACK BY AND VERIFIED WITH: CRAWFORD,H RN @1540  05/14/20 BY JONES,T (NOTE) SARS-CoV-2 target nucleic acids are DETECTED  SARS-CoV-2 RNA is generally detectable in upper respiratory specimens  during the acute phase of infection.  Positive results are indicative  of the presence of the identified virus, but do not rule out bacterial infection or co-infection with other pathogens not detected by the test.  Clinical correlation with patient history and  other diagnostic information is necessary to determine patient infection status.  The expected result is negative.  Fact Sheet for Patients:   StrictlyIdeas.no   Fact Sheet for Healthcare Providers:   BankingDealers.co.za    This test is not yet approved or cleared by the [place] FDA and  has been authorized for detection and/or diagnosis of SARS-CoV-2 by FDA under an Emergency Use Authorization (EUA).  This EUA will remain in effect (meaning this  test can be used) for the duration of  the COVID-19 declaration under Section 564(b)(1) of the Act, 21 U.S.C. section 360-bbb-3(b)(1), unless the authorization is terminated or revoked sooner.  Performed at [facility], [address], [place], [place] [address]   MRSA PCR Screening     Status: None   Collection Time: 05/15/20  4:45 PM   Specimen: Nasal Mucosa; Nasopharyngeal  Result Value Ref Range Status   MRSA by PCR NEGATIVE NEGATIVE Final    Comment:        The GeneXpert MRSA Assay (FDA approved for NASAL specimens only), is one component of a comprehensive MRSA colonization surveillance program. It is not intended to diagnose MRSA infection nor to guide or monitor treatment for MRSA infections. Performed at [facility], [address], [place], [place] [address]      Radiology Studies: DG Chest Portable 1 View  Result Date: 05/16/2020 CLINICAL  DATA:  80 year old female with dialysis catheter placement. EXAM: PORTABLE CHEST 1 VIEW COMPARISON:  Chest radiograph dated 05/14/2020. FINDINGS: Right-sided dialysis catheter with tip at the cavoatrial junction. There is mild cardiomegaly with mild vascular congestion. Overall improved interstitial density  seen on the prior radiograph. No focal consolidation, pleural effusion or pneumothorax. Atherosclerotic calcification of the aorta. Median sternotomy wires. No acute osseous pathology. IMPRESSION: 1. Dialysis catheter with tip at the cavoatrial junction. No pneumothorax. 2. Mild cardiomegaly with mild vascular congestion, improved since the prior radiograph. Electronically Signed   By: Anner Crete M.D.   On: 05/16/2020 15:56   DG C-Arm 1-60 Min-No Report  Result Date: 05/16/2020 Fluoroscopy was utilized by the requesting physician.  No radiographic interpretation.   Scheduled Meds: . Chlorhexidine Gluconate Cloth  6 each Topical Q0600  . cinacalcet  30 mg Oral Q breakfast  . darbepoetin (ARANESP) injection - DIALYSIS  40 mcg Intravenous Q Fri-HD  . exemestane  25 mg Oral QPC breakfast  . isosorbide mononitrate  60 mg Oral Daily  . mirtazapine  7.5 mg Oral QHS  . NIFEdipine  90 mg Oral Daily  . nystatin  5 mL Mouth/Throat QID  . pantoprazole  40 mg Oral Daily  . predniSONE  50 mg Oral Daily   Continuous Infusions: . sodium chloride    . sodium chloride    . heparin 750 Units/hr (05/18/20 1011)     LOS: 4 days    Time spent: 35 minutes  Barton Dubois, MD [facility]   To contact the attending provider between 7A-7P or the covering provider during after hours 7P-7A, please log into the web site [email or web address] and access using universal [place] password for that web site. If you do not have the password, please call the hospital operator.  05/18/2020, 11:45 AM

## 2020-05-18 NOTE — Progress Notes (Signed)
ANTICOAGULATION CONSULT NOTE  Pharmacy Consult for Heparin Indication: atrial fibrillation  No Known Allergies  Patient Measurements: Height: 5\' 3"  (160 cm) Weight: 63.3 kg (139 lb 8.8 oz) IBW/kg (Calculated) : 52.4 Heparin Dosing Weight: 68 kg  Vital Signs: Temp: 98.2 F (36.8 C) (02/06 0600) Temp Source: Oral (02/06 0600) BP: 92/61 (02/06 0600) Pulse Rate: 78 (02/06 0600)  Labs: Recent Labs    05/15/20 2055 05/16/20 0318 05/16/20 0318 05/17/20 0903 05/18/20 0642  HGB  --  8.9*   < > 11.1* 9.8*  HCT  --  25.8*  --  35.1* 31.9*  PLT  --  210  --  273 257  HEPARINUNFRC 1.35*  --   --  0.47 0.44  CREATININE  --  14.76*  --  8.56*  --    < > = values in this interval not displayed.    Estimated Creatinine Clearance: 4.9 mL/min (A) (by C-G formula based on SCr of 8.56 mg/dL (H)).   Medical History: Past Medical History:  Diagnosis Date  . Cancer ([place])   . CHF (congestive heart failure) ([place])   . Chronic kidney disease   . Hypertension     Assessment: 80 yr old female presented with COVID. Noted pt with AKI requiring emergent HD. To begin heparin for afib. No anticoagulants PTA. D-dimer 8.96>10.56, Hgb 9.7, Hct 27.3, platelets 280.  2/3 PM Heparin level ~10 hrs after heparin infusion was decreased to 850 units/hr was 1.35 units/ml, which is above the goal range for this pt. The HL was drawn by the HD RN; it's possible that the heparin level could have been affected by heparin used for HD.  2/4 Heparin was  stopped at midnight 2/3 for surgery 2/3(TDC). Surgeon has instructed [place] to restart heparin at 2130.    HL 0.45 is  therapeutic  Goals: Heparin level: 0.3-0.7 units/ml Monitor platelets by anticoagulation protocol: Yes   Plan:  Continue heparin at 750 units/hr Daily heparin level and CBC Monitor for bleeding  Isac Sarna, BS [place], BCPS Clinical Pharmacist Pager [phone] 05/18/2020 7:41 AM

## 2020-05-19 ENCOUNTER — Encounter (HOSPITAL_COMMUNITY): Payer: Self-pay | Admitting: General Surgery

## 2020-05-19 LAB — CBC WITH DIFFERENTIAL/PLATELET
Abs Immature Granulocytes: 0.5 10*3/uL — ABNORMAL HIGH (ref 0.00–0.07)
Basophils Absolute: 0 10*3/uL (ref 0.0–0.1)
Basophils Relative: 0 %
Eosinophils Absolute: 0 10*3/uL (ref 0.0–0.5)
Eosinophils Relative: 0 %
HCT: 32.3 % — ABNORMAL LOW (ref 36.0–46.0)
Hemoglobin: 9.5 g/dL — ABNORMAL LOW (ref 12.0–15.0)
Immature Granulocytes: 2 %
Lymphocytes Relative: 8 %
Lymphs Abs: 1.7 10*3/uL (ref 0.7–4.0)
MCH: 29.8 pg (ref 26.0–34.0)
MCHC: 29.4 g/dL — ABNORMAL LOW (ref 30.0–36.0)
MCV: 101.3 fL — ABNORMAL HIGH (ref 80.0–100.0)
Monocytes Absolute: 1.4 10*3/uL — ABNORMAL HIGH (ref 0.1–1.0)
Monocytes Relative: 7 %
Neutro Abs: 17.1 10*3/uL — ABNORMAL HIGH (ref 1.7–7.7)
Neutrophils Relative %: 83 %
Platelets: 224 10*3/uL (ref 150–400)
RBC: 3.19 MIL/uL — ABNORMAL LOW (ref 3.87–5.11)
RDW: 14 % (ref 11.5–15.5)
WBC: 20.8 10*3/uL — ABNORMAL HIGH (ref 4.0–10.5)
nRBC: 1.7 % — ABNORMAL HIGH (ref 0.0–0.2)

## 2020-05-19 LAB — COMPREHENSIVE METABOLIC PANEL
ALT: 9 U/L (ref 0–44)
AST: 21 U/L (ref 15–41)
Albumin: 2.6 g/dL — ABNORMAL LOW (ref 3.5–5.0)
Alkaline Phosphatase: 53 U/L (ref 38–126)
Anion gap: 19 — ABNORMAL HIGH (ref 5–15)
BUN: 90 mg/dL — ABNORMAL HIGH (ref 8–23)
CO2: 17 mmol/L — ABNORMAL LOW (ref 22–32)
Calcium: 6.1 mg/dL — CL (ref 8.9–10.3)
Chloride: 95 mmol/L — ABNORMAL LOW (ref 98–111)
Creatinine, Ser: 12.33 mg/dL — ABNORMAL HIGH (ref 0.44–1.00)
GFR, Estimated: 3 mL/min — ABNORMAL LOW (ref >60)
Glucose, Bld: 116 mg/dL — ABNORMAL HIGH (ref 70–99)
Potassium: 5.3 mmol/L — ABNORMAL HIGH (ref 3.5–5.1)
Sodium: 131 mmol/L — ABNORMAL LOW (ref 135–145)
Total Bilirubin: 0.6 mg/dL (ref 0.3–1.2)
Total Protein: 6.4 g/dL — ABNORMAL LOW (ref 6.5–8.1)

## 2020-05-19 LAB — HEPARIN LEVEL (UNFRACTIONATED): Heparin Unfractionated: 0.37 IU/mL (ref 0.30–0.70)

## 2020-05-19 LAB — GLUCOSE, CAPILLARY
Glucose-Capillary: 106 mg/dL — ABNORMAL HIGH (ref 70–99)
Glucose-Capillary: 120 mg/dL — ABNORMAL HIGH (ref 70–99)
Glucose-Capillary: 127 mg/dL — ABNORMAL HIGH (ref 70–99)
Glucose-Capillary: 130 mg/dL — ABNORMAL HIGH (ref 70–99)
Glucose-Capillary: 135 mg/dL — ABNORMAL HIGH (ref 70–99)
Glucose-Capillary: 143 mg/dL — ABNORMAL HIGH (ref 70–99)

## 2020-05-19 LAB — CULTURE, BLOOD (ROUTINE X 2)
Culture: NO GROWTH
Culture: NO GROWTH
Special Requests: ADEQUATE
Special Requests: ADEQUATE

## 2020-05-19 LAB — D-DIMER, QUANTITATIVE: D-Dimer, Quant: 3.76 ug/mL-FEU — ABNORMAL HIGH (ref 0.00–0.50)

## 2020-05-19 MED ORDER — HEPARIN SODIUM (PORCINE) 1000 UNIT/ML DIALYSIS
20.0000 [IU]/kg | INTRAMUSCULAR | Status: DC | PRN
  Filled 2020-05-19: qty 2

## 2020-05-19 MED ORDER — CHLORHEXIDINE GLUCONATE CLOTH 2 % EX PADS
6.0000 | MEDICATED_PAD | Freq: Every day | CUTANEOUS | Status: DC
  Administered 2020-05-19 – 2020-05-21 (×2): 6 via TOPICAL

## 2020-05-19 MED ORDER — APIXABAN 2.5 MG PO TABS
2.5000 mg | ORAL_TABLET | Freq: Two times a day (BID) | ORAL | Status: DC
  Administered 2020-05-19 – 2020-05-21 (×6): 2.5 mg via ORAL
  Filled 2020-05-19 (×6): qty 1

## 2020-05-19 MED ORDER — HYDROMORPHONE HCL 1 MG/ML IJ SOLN
0.5000 mg | Freq: Four times a day (QID) | INTRAMUSCULAR | Status: DC | PRN
  Administered 2020-05-19: 0.5 mg via INTRAVENOUS
  Filled 2020-05-19: qty 0.5

## 2020-05-19 MED ORDER — CALCITRIOL 0.25 MCG PO CAPS
0.5000 ug | ORAL_CAPSULE | Freq: Every day | ORAL | Status: DC
  Administered 2020-05-19 – 2020-05-21 (×3): 0.5 ug via ORAL
  Filled 2020-05-19 (×3): qty 2

## 2020-05-19 MED ORDER — GUAIFENESIN-DM 100-10 MG/5ML PO SYRP
5.0000 mL | ORAL_SOLUTION | ORAL | Status: DC | PRN
  Administered 2020-05-19 – 2020-05-21 (×4): 5 mL via ORAL
  Filled 2020-05-19 (×4): qty 5

## 2020-05-19 MED ORDER — CALCIUM GLUCONATE-NACL 1-0.675 GM/50ML-% IV SOLN
1.0000 g | Freq: Once | INTRAVENOUS | Status: AC
  Administered 2020-05-19: 1000 mg via INTRAVENOUS
  Filled 2020-05-19: qty 50

## 2020-05-19 MED ORDER — CALCIUM ACETATE (PHOS BINDER) 667 MG PO CAPS
667.0000 mg | ORAL_CAPSULE | Freq: Three times a day (TID) | ORAL | Status: DC
  Administered 2020-05-19 – 2020-05-21 (×7): 667 mg via ORAL
  Filled 2020-05-19 (×8): qty 1

## 2020-05-19 NOTE — Progress Notes (Signed)
Subjective:  Events of weekend noted, HD on Friday- otherwise seems pretty stable- BP has been soft but has been on procardia 90 daily - has been getting remdesivir and steroids for COVID- due for another HD treatment today - K is 5.3 but [place]se note says it is 6.1 ?     Objective Vital signs in last 24 hours: Vitals:   05/18/20 0600 05/18/20 1139 05/18/20 2122 05/19/20 0600  BP: 92/61 (!) 113/93 91/65 (!) 96/56  Pulse: 78 88 89 73  Resp: 16 18 18 16   Temp: 98.2 F (36.8 C) 98.3 F (36.8 C) 98.1 F (36.7 C) 98.3 F (36.8 C)  TempSrc: Oral Oral  Oral  SpO2: 94% 97% 94% 98%  Weight:      Height:       Weight change:   Intake/Output Summary (Last 24 hours) at 05/19/2020 8299 Last data filed at 05/18/2020 2121 Gross per 24 hour  Intake 560 ml  Output -  Net 560 ml    Assessment/ Plan: Pt is a 80 y.o. yo female with advanced CKD, CAD, HTN who was admitted on 05/14/2020 with COVID and need for emergent start to dialysis  Assessment/Plan: 1. COVID-  Fortunately no O2 req-  On steroids and remdesivir 2. ESRD -  New start.  Had HD Wed, Thurs and Saturday.  Will need fourth treatment today.  Need top make sure someone is working on getting her placed to an OP unit-  Was previously followed bu Dr. Lindalou Hose in [place] -  Ultimate plan was to do PD 3. 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 HTN/volume- BP low, stop procardia-  Does not seem overloaded-  Will run even with HD   [name]    Labs: Basic Metabolic Panel: Recent Labs  Lab 05/14/20 1422 05/14/20 1425 05/17/20 0903 05/18/20 0642 05/19/20 0514  NA  --    < > 134* 133* 131*  K  --    < > 3.8 4.3 5.3*  CL  --    < > 96* 96* 95*  CO2  --    < > 23 23 17*  GLUCOSE  --    < > 126* 154* 116*   BUN  --    < > 47* 67* 90*  CREATININE  --    < > 8.56* 10.38* 12.33*  CALCIUM  --    < > 9.0 8.3* 6.1*  PHOS 8.5*  --   --   --   --    < > = values in this interval not displayed.   Liver Function Tests: Recent Labs  Lab 05/17/20 0903 05/18/20 0642 05/19/20 0514  AST 19 22 21   ALT 11 10 9   ALKPHOS 63 55 53  BILITOT 0.8 0.7 0.6  PROT 7.2 6.6 6.4*  ALBUMIN 2.9* 2.7* 2.6*   No results for input(s): LIPASE, AMYLASE in the last 168 hours. No results for input(s): AMMONIA in the last 168 hours. CBC: Recent Labs  Lab 05/15/20 0500 05/16/20 0318 05/17/20 0903 05/18/20 0642 05/19/20 0514  WBC 5.4 8.3 10.3 12.8* 20.8*  NEUTROABS 4.8 7.4 9.2* 10.8* 17.1*  HGB 9.7* 8.9* 11.1* 9.8* 9.5*  HCT 27.3* 25.8* 35.1* 31.9* 32.3*  MCV  98.2 98.1 95.9 97.0 101.3*  PLT 280 210 273 257 224   Cardiac Enzymes: No results for input(s): CKTOTAL, CKMB, CKMBINDEX, TROPONINI in the last 168 hours. CBG: Recent Labs  Lab 05/18/20 1658 05/18/20 2007 05/19/20 0025 05/19/20 0429 05/19/20 0801  GLUCAP 167* 151* 135* 143* 120*    Iron Studies: No results for input(s): IRON, TIBC, TRANSFERRIN, FERRITIN in the last 72 hours. Studies/Results: No results found. Medications: Infusions: . sodium chloride    . sodium chloride    . heparin 750 Units/hr (05/18/20 1011)    Scheduled Medications: . Chlorhexidine Gluconate Cloth  6 each Topical Q0600  . cinacalcet  30 mg Oral Q breakfast  . darbepoetin (ARANESP) injection - DIALYSIS  40 mcg Intravenous Q Fri-HD  . exemestane  25 mg Oral QPC breakfast  . isosorbide mononitrate  60 mg Oral Daily  . mirtazapine  7.5 mg Oral QHS  . NIFEdipine  90 mg Oral Daily  . nystatin  5 mL Mouth/Throat QID  . pantoprazole  40 mg Oral Daily  . predniSONE  50 mg Oral Daily    have reviewed scheduled and prn medications.  Physical Exam: Exam not performed due to covid positive status-  Notes from other providers reviewed  05/19/2020,8:08 AM  LOS: 5 days

## 2020-05-19 NOTE — TOC Transition Note (Signed)
Transition of Care [facility]) - CM/SW Discharge Note   Patient Details  Name: Rose Freeman MRN: [id number] Date of Birth: [birth year]  Transition of Care [facility]) CM/SW Contact:  Natasha Bence, LCSW Phone Number: 05/19/2020, 12:10 PM   Clinical Narrative:    CSW notified of patient's readiness for discharge. CSW received dialysis appointment schechedule for patient with [facility] for Tuesday, Thursday, Saturday schedule beginning at 12:00pm. CSW notified MD. TOC signing off.      Barriers to Discharge: Continued Medical Work up   Patient Goals and CMS Choice Patient states their goals for this hospitalization and ongoing recovery are:: return home      Discharge Placement                       Discharge Plan and Services   Discharge Planning Services:  ([place], walker, [place], [facility])                                 Social Determinants of Health (SDOH) Interventions     Readmission Risk Interventions No flowsheet data found.

## 2020-05-19 NOTE — Progress Notes (Signed)
PROGRESS NOTE    Rose Freeman  YHC:[id number] DOB: Mar 30, 1941 DOA: 05/14/2020 PCP: Neale Burly, MD   Chief Complaint  Patient presents with  . Weakness    Brief admission Narrative:  Rose Freeman  is a 80 y.o. female, with past medical history of CKD stage V, anemia, unspecified, CHF unspecified, hypothyroidism, hypertension, CAD status post CABG, patient presents to ED secondary to cough and weakness, she is unvaccinated against Covid, she presents with cough for last 4 days, patient is very poor historian, it was obtained from daughter by phone, patient with stage V kidney disease, being planned for peritoneal dialysis, upon presentation to ED work-up significant for creatinine of 28, potassium of 7.3, BUN of 177, and she is COVID-19 positive, with chest x-ray for multifocal opacity, but no oxygen requirement, she has anemia at 9.7, afebrile, no leukocytosis, normal lactic acid and D-dimer elevated at 8.9, [facility] hospitalist consulted to admit.  Assessment & Plan: 1-acute kidney injury on chronic kidney disease stage V: with hyperkalemia -Emergently started on dialysis -potassium elevated at 6.5; calcium gluconate given -Patient due for dialysis today which will further stabilize electrolytes. -Continue to follow nephrology service recommendations for further dialysis treatment. -Continue to maintain adequate hydration. -Adjust medications to appropriate Creatinine clearance. -TDC placed by surgery on 05/16/20; planning to initiate clipping process next week. -anticipated next dialysis treatment on 05/19/2020.  2-anemia of chronic kidney disease -Will follow nephrology service recommendations for epogen and IV iron -No overt BLEEDING appreciated -Continue to follow Hgb trend  3-hx of CAD -no CP currently -continue telemetry monitoring -continue Imdur  4-COVID-19 infection -family reported worsening SOB and hypoxia with ambulation -checking desaturation screening  today. -continue tx with steroids (starting tapering process) -no fever, no complaints of SOB -patient has completed Remdesivir infusion. -Continue supportive care.  5-HTN -anticipating HD will help with BP control -continue current antihypertensive agents.   6-hyperphosphatemia -continue HD -continue sensipar   7-physical deconditioning  -will arrange for home health services at time of discharge -generalized weakness in the setting of COVID -19 infection and uremia.   8-chronic atrial fibrillation  -resume Eliquis 2.5 mg twice daily. -Patient's Procardia has been held secondary to soft blood pressure. -Follow vital signs and heart rate on telemetry prior to further resume at a lower dose.  9-thrush/esophageal candidiasis -Continue treatment with nystatin -Diet consistency adjusted to facilitate swallowing. -Patient reports less discomfort in her throat and is swallowing better.  DVT prophylaxis: heparin  Code Status: Full code. Family Communication: no family at bedside. Disposition:   Status is: Inpatient  Dispo: The patient is from: home.              Anticipated d/c is to: home with home health services.              Anticipated d/c date is: 3-4 days              Patient currently no medically stable for discharge; continue to follow nephrology guidelines for further hemodialysis and anticipated tunneled catheter placement next week.  Continue treatment with steroids and remdesivir for COVID infection. Will follow clipping process and arrangements for outpatient HD.   Difficult to place patient : no    Consultants:   Nephrology service  General surgery for dialysis catheter placement.   Procedures:  See below reports   Antimicrobials/antiviral -Remdesivir   Subjective: No chest pain, no shortness of breath, no nausea, no vomiting.  Good oxygen  saturation on room air appreciated.  Objective:  Vitals:   05/19/20 1428 05/19/20 1620 05/19/20 1630  05/19/20 1700  BP: 122/81 115/73 108/72 110/71  Pulse: 95 87 92 90  Resp: 18 16    Temp: 97.7 F (36.5 C) 98 F (36.7 C)    TempSrc: Oral Oral    SpO2: 93% 98%    Weight:  66.4 kg    Height:        Intake/Output Summary (Last 24 hours) at 05/19/2020 1730 Last data filed at 05/19/2020 1529 Gross per 24 hour  Intake 440 ml  Output -  Net 440 ml   Filed Weights   05/15/20 1645 05/15/20 1745 05/19/20 1620  Weight: 63.3 kg 63.3 kg 66.4 kg    Examination: General exam: Afebrile, no chest pain, no nausea, no vomiting, no complaining of shortness of breath no requiring oxygen supplementation.  Respiratory system: Clear to auscultation. Respiratory effort normal.  No using accessory muscles.  No wheezing or crackles appreciated on exam. Cardiovascular system: Rate controlled, no rubs, no gallops, no JVD on exam.. Gastrointestinal system: Abdomen is nondistended, soft and nontender. No organomegaly or masses felt. Normal bowel sounds heard. Central nervous system: No focal neurological deficits. Extremities: No cyanosis or clubbing. Skin: No rashes, lesions or ulcers Psychiatry: Mood & affect appropriate.    Data Reviewed: I have personally reviewed following labs and imaging studies  CBC: Recent Labs  Lab 05/15/20 0500 05/16/20 0318 05/17/20 0903 05/18/20 0642 05/19/20 0514  WBC 5.4 8.3 10.3 12.8* 20.8*  NEUTROABS 4.8 7.4 9.2* 10.8* 17.1*  HGB 9.7* 8.9* 11.1* 9.8* 9.5*  HCT 27.3* 25.8* 35.1* 31.9* 32.3*  MCV 98.2 98.1 95.9 97.0 101.3*  PLT 280 210 273 257 094    Basic Metabolic Panel: Recent Labs  Lab 05/14/20 1422 05/14/20 1425 05/15/20 0500 05/16/20 0318 05/17/20 0903 05/18/20 0642 05/19/20 0514  NA  --    < > 138 137 134* 133* 131*  K  --    < > 4.7 4.0 3.8 4.3 5.3*  CL  --    < > 101 99 96* 96* 95*  CO2  --    < > 20* 23 23 23  17*  GLUCOSE  --    < > 166* 149* 126* 154* 116*  BUN  --    < > 117* 86* 47* 67* 90*  CREATININE  --    < > 20.04* 14.76* 8.56*  10.38* 12.33*  CALCIUM  --    < > 8.6* 8.8* 9.0 8.3* 6.1*  PHOS 8.5*  --   --   --   --   --   --    < > = values in this interval not displayed.    GFR: Estimated Creatinine Clearance: 3.5 mL/min (A) (by C-G formula based on SCr of 12.33 mg/dL (H)).  Liver Function Tests: Recent Labs  Lab 05/15/20 0500 05/16/20 0318 05/17/20 0903 05/18/20 0642 05/19/20 0514  AST 18 18 19 22 21   ALT 9 9 11 10 9   ALKPHOS 69 57 63 55 53  BILITOT 0.7 0.7 0.8 0.7 0.6  PROT 7.1 6.7 7.2 6.6 6.4*  ALBUMIN 2.6* 2.5* 2.9* 2.7* 2.6*    CBG: Recent Labs  Lab 05/19/20 0025 05/19/20 0429 05/19/20 0801 05/19/20 1144 05/19/20 1632  GLUCAP 135* 143* 120* 130* 127*     Recent Results (from the past 240 hour(s))  Blood Culture (routine x 2)     Status: None   Collection Time: 05/14/20  2:23 PM   Specimen: BLOOD  Result  Value Ref Range Status   Specimen Description BLOOD LEFT WRIST  Final   Special Requests   Final    BOTTLES DRAWN AEROBIC AND ANAEROBIC Blood Culture adequate volume   Culture   Final    NO GROWTH 5 DAYS Performed at [facility], [address] [place], [place] [address]    Report Status 05/19/2020 FINAL  Final  Blood Culture (routine x 2)     Status: None   Collection Time: 05/14/20  2:24 PM   Specimen: BLOOD  Result Value Ref Range Status   Specimen Description BLOOD LEFT HAND  Final   Special Requests   Final    BOTTLES DRAWN AEROBIC AND ANAEROBIC Blood Culture adequate volume   Culture   Final    NO GROWTH 5 DAYS Performed at [facility], [address] [place], [place] [address]    Report Status 05/19/2020 FINAL  Final  SARS Coronavirus 2 by RT PCR (hospital order, performed in [place] hospital lab) Nasopharyngeal Nasopharyngeal Swab     Status: Abnormal   Collection Time: 05/14/20  2:28 PM   Specimen: Nasopharyngeal Swab  Result Value Ref Range Status   SARS Coronavirus 2 POSITIVE (A) NEGATIVE Final    Comment: RESULT CALLED TO, READ BACK BY AND VERIFIED  WITH: CRAWFORD,H RN @1540  05/14/20 BY JONES,T (NOTE) SARS-CoV-2 target nucleic acids are DETECTED  SARS-CoV-2 RNA is generally detectable in upper respiratory specimens  during the acute phase of infection.  Positive results are indicative  of the presence of the identified virus, but do not rule out bacterial infection or co-infection with other pathogens not detected by the test.  Clinical correlation with patient history and  other diagnostic information is necessary to determine patient infection status.  The expected result is negative.  Fact Sheet for Patients:   StrictlyIdeas.no   Fact Sheet for Healthcare Providers:   BankingDealers.co.za    This test is not yet approved or cleared by the [place] FDA and  has been authorized for detection and/or diagnosis of SARS-CoV-2 by FDA under an Emergency Use Authorization (EUA).  This EUA will remain in effect (meaning this  test can be used) for the duration of  the COVID-19 declaration under Section 564(b)(1) of the Act, 21 U.S.C. section 360-bbb-3(b)(1), unless the authorization is terminated or revoked sooner.  Performed at [facility], [address], [place], [place] [address]   MRSA PCR Screening     Status: None   Collection Time: 05/15/20  4:45 PM   Specimen: Nasal Mucosa; Nasopharyngeal  Result Value Ref Range Status   MRSA by PCR NEGATIVE NEGATIVE Final    Comment:        The GeneXpert MRSA Assay (FDA approved for NASAL specimens only), is one component of a comprehensive MRSA colonization surveillance program. It is not intended to diagnose MRSA infection nor to guide or monitor treatment for MRSA infections. Performed at [facility], [address] [place], [place] [address]      Radiology Studies: No results found. Scheduled Meds: . apixaban  2.5 mg Oral BID  . calcitRIOL  0.5 mcg Oral Daily  . calcium acetate  667 mg Oral TID WC  . Chlorhexidine  Gluconate Cloth  6 each Topical Q0600  . Chlorhexidine Gluconate Cloth  6 each Topical Q0600  . cinacalcet  30 mg Oral Q breakfast  . darbepoetin (ARANESP) injection - DIALYSIS  40 mcg Intravenous Q Fri-HD  . exemestane  25 mg Oral QPC breakfast  .  isosorbide mononitrate  60 mg Oral Daily  . mirtazapine  7.5 mg Oral QHS  . nystatin  5 mL Mouth/Throat QID  . pantoprazole  40 mg Oral Daily  . predniSONE  50 mg Oral Daily   Continuous Infusions: . sodium chloride    . sodium chloride       LOS: 5 days    Time spent: 30 minutes  Barton Dubois, MD [facility]   To contact the attending provider between 7A-7P or the covering provider during after hours 7P-7A, please log into the web site [email or web address] and access using universal [place] password for that web site. If you do not have the password, please call the hospital operator.  05/19/2020, 5:30 PM

## 2020-05-19 NOTE — Procedures (Signed)
   HEMODIALYSIS TREATMENT NOTE:  Uneventful 3.5 hour HD session completed via [facility] TDC. Kept even / no UF as per orders.  All blood was returned.  No changes from pre-HD assessment.   Rockwell Alexandria, RN

## 2020-05-19 NOTE — Progress Notes (Signed)
CRITICAL VALUE ALERT  Critical Value:  Potassium 6.1  Date & Time Notied:  05/19/2020 0820  Provider Notified: Dr. Dyann Kief   Orders Received/Actions taken: no new orders at this time

## 2020-05-19 NOTE — Progress Notes (Signed)
ANTICOAGULATION CONSULT NOTE  Pharmacy Consult for Heparin Indication: atrial fibrillation  No Known Allergies  Patient Measurements: Height: 5\' 3"  (160 cm) Weight: 63.3 kg (139 lb 8.8 oz) IBW/kg (Calculated) : 52.4 Heparin Dosing Weight: 68 kg  Vital Signs: Temp: 98.3 F (36.8 C) (02/07 0600) Temp Source: Oral (02/07 0600) BP: 96/56 (02/07 0600) Pulse Rate: 73 (02/07 0600)  Labs: Recent Labs    05/17/20 0903 05/18/20 0642 05/19/20 0514  HGB 11.1* 9.8* 9.5*  HCT 35.1* 31.9* 32.3*  PLT 273 257 224  HEPARINUNFRC 0.47 0.44 0.37  CREATININE 8.56* 10.38*  --     Estimated Creatinine Clearance: 4.1 mL/min (A) (by C-G formula based on SCr of 10.38 mg/dL (H)).   Medical History: Past Medical History:  Diagnosis Date  . Cancer ([place])   . CHF (congestive heart failure) ([place])   . Chronic kidney disease   . Hypertension     Assessment: 80 yr old female presented with COVID. Noted pt with AKI requiring emergent HD. To begin heparin for afib. No anticoagulants PTA. D-dimer 8.96>10.56, Hgb 9.7, Hct 27.3, platelets 280.  2/3 PM Heparin level ~10 hrs after heparin infusion was decreased to 850 units/hr was 1.35 units/ml, which is above the goal range for this pt. The HL was drawn by the HD RN; it's possible that the heparin level could have been affected by heparin used for HD.  2/4 Heparin was  stopped at midnight 2/3 for surgery 2/3(TDC). Surgeon has instructed [place] to restart heparin at 2130.    HL 0.37 is  therapeutic  Goals: Heparin level: 0.3-0.7 units/ml Monitor platelets by anticoagulation protocol: Yes   Plan:  Continue heparin at 750 units/hr Daily heparin level and CBC Monitor for bleeding  Isac Sarna, BS [place], BCPS Clinical Pharmacist Pager [phone] 05/19/2020 7:38 AM

## 2020-05-20 LAB — RENAL FUNCTION PANEL
Albumin: 2.5 g/dL — ABNORMAL LOW (ref 3.5–5.0)
Anion gap: 11 (ref 5–15)
BUN: 40 mg/dL — ABNORMAL HIGH (ref 8–23)
CO2: 26 mmol/L (ref 22–32)
Calcium: 8.1 mg/dL — ABNORMAL LOW (ref 8.9–10.3)
Chloride: 95 mmol/L — ABNORMAL LOW (ref 98–111)
Creatinine, Ser: 6.46 mg/dL — ABNORMAL HIGH (ref 0.44–1.00)
GFR, Estimated: 6 mL/min — ABNORMAL LOW (ref >60)
Glucose, Bld: 119 mg/dL — ABNORMAL HIGH (ref 70–99)
Phosphorus: 6.1 mg/dL — ABNORMAL HIGH (ref 2.5–4.6)
Potassium: 4.1 mmol/L (ref 3.5–5.1)
Sodium: 132 mmol/L — ABNORMAL LOW (ref 135–145)

## 2020-05-20 LAB — GLUCOSE, CAPILLARY
Glucose-Capillary: 115 mg/dL — ABNORMAL HIGH (ref 70–99)
Glucose-Capillary: 123 mg/dL — ABNORMAL HIGH (ref 70–99)
Glucose-Capillary: 138 mg/dL — ABNORMAL HIGH (ref 70–99)
Glucose-Capillary: 145 mg/dL — ABNORMAL HIGH (ref 70–99)
Glucose-Capillary: 158 mg/dL — ABNORMAL HIGH (ref 70–99)
Glucose-Capillary: 166 mg/dL — ABNORMAL HIGH (ref 70–99)

## 2020-05-20 MED ORDER — PREDNISONE 20 MG PO TABS
40.0000 mg | ORAL_TABLET | Freq: Every day | ORAL | Status: DC
  Administered 2020-05-21: 40 mg via ORAL
  Filled 2020-05-20: qty 2

## 2020-05-20 MED ORDER — ISOSORBIDE MONONITRATE ER 60 MG PO TB24
30.0000 mg | ORAL_TABLET | Freq: Every day | ORAL | Status: DC
  Administered 2020-05-21: 30 mg via ORAL
  Filled 2020-05-20: qty 1

## 2020-05-20 MED ORDER — NIFEDIPINE ER OSMOTIC RELEASE 30 MG PO TB24
60.0000 mg | ORAL_TABLET | Freq: Every day | ORAL | Status: DC
  Administered 2020-05-20 – 2020-05-21 (×2): 60 mg via ORAL
  Filled 2020-05-20 (×2): qty 2

## 2020-05-20 NOTE — Progress Notes (Signed)
Ambulated slowly with walker to door and then back to chair.  Says uses rollator at home and daughter helps her.  Has been sitting up in chair for an hour and half now.

## 2020-05-20 NOTE — Progress Notes (Signed)
Subjective:  HD yest-  No volume removal - tolerated well - BP higher this AM after stopping procardia yesterday     Objective Vital signs in last 24 hours: Vitals:   05/19/20 2000 05/19/20 2010 05/19/20 2122 05/20/20 0530  BP: 118/68 138/76 119/72 (!) 144/80  Pulse: 81 90 89 76  Resp:  18 16 16   Temp:   98.2 F (36.8 C) 98 F (36.7 C)  TempSrc:   Oral Oral  SpO2:   95% 95%  Weight:      Height:       Weight change:   Intake/Output Summary (Last 24 hours) at 05/20/2020 1093 Last data filed at 05/19/2020 2144 Gross per 24 hour  Intake 440 ml  Output 0 ml  Net 440 ml    Assessment/ Plan: Pt is a 80 y.o. yo female with advanced CKD, CAD, HTN who was admitted on 05/14/2020 with COVID and need for emergent start to dialysis  Assessment/Plan: 1. COVID-  Fortunately no O2 req-  On steroids and remdesivir 2. ESRD -  New start.  Had HD Wed, Thurs and Saturday.  fourth treatment Monday 2/7.  Pt has a spot at [facility] TTS second shift arranged.  I think OK to wait until Thursday for next treatment.  Will be here vs her OP unit via [place].    Ultimate plan was to do PD- can be sorted out as OP 3. Anemia-  Started on Darbe 40 q week -  hgb in the 9's- iron is replete- to be resumed on eliquis 2.5 BID 4. 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 HTN/volume- BP was low, stopped procardia-  Does not seem overloaded-  running even with HD but higher BP will allow [place] to pull some once she starts eating and drinking more    [name]    Labs: Basic Metabolic Panel: Recent Labs  Lab 05/14/20 1422 05/14/20 1425 05/18/20 0642 05/19/20 0514 05/20/20 0623  NA  --    < > 133* 131* 132*  K  --    < > 4.3 5.3* 4.1  CL  --    < > 96* 95* 95*  CO2  --    < > 23 17* 26  GLUCOSE  --    < > 154* 116* 119*  BUN  --    < > 67* 90*  40*  CREATININE  --    < > 10.38* 12.33* 6.46*  CALCIUM  --    < > 8.3* 6.1* 8.1*  PHOS 8.5*  --   --   --  6.1*   < > = values in this interval not displayed.   Liver Function Tests: Recent Labs  Lab 05/17/20 0903 05/18/20 0642 05/19/20 0514 05/20/20 0623  AST 19 22 21   --   ALT 11 10 9   --   ALKPHOS 63 55 53  --   BILITOT 0.8 0.7 0.6  --   PROT 7.2 6.6 6.4*  --   ALBUMIN 2.9* 2.7* 2.6* 2.5*   No results for input(s): LIPASE, AMYLASE in the last 168 hours. No results for input(s): AMMONIA in the last 168 hours. CBC: Recent Labs  Lab 05/15/20 0500 05/16/20 0318 05/17/20 0903 05/18/20 0642 05/19/20 0514  WBC 5.4 8.3 10.3 12.8* 20.8*  NEUTROABS 4.8 7.4 9.2*  10.8* 17.1*  HGB 9.7* 8.9* 11.1* 9.8* 9.5*  HCT 27.3* 25.8* 35.1* 31.9* 32.3*  MCV 98.2 98.1 95.9 97.0 101.3*  PLT 280 210 273 257 224   Cardiac Enzymes: No results for input(s): CKTOTAL, CKMB, CKMBINDEX, TROPONINI in the last 168 hours. CBG: Recent Labs  Lab 05/19/20 1632 05/19/20 2021 05/20/20 0007 05/20/20 0407 05/20/20 0745  GLUCAP 127* 106* 138* 123* 115*    Iron Studies: No results for input(s): IRON, TIBC, TRANSFERRIN, FERRITIN in the last 72 hours. Studies/Results: No results found. Medications: Infusions: . sodium chloride    . sodium chloride      Scheduled Medications: . apixaban  2.5 mg Oral BID  . calcitRIOL  0.5 mcg Oral Daily  . calcium acetate  667 mg Oral TID WC  . Chlorhexidine Gluconate Cloth  6 each Topical Q0600  . Chlorhexidine Gluconate Cloth  6 each Topical Q0600  . cinacalcet  30 mg Oral Q breakfast  . darbepoetin (ARANESP) injection - DIALYSIS  40 mcg Intravenous Q Fri-HD  . exemestane  25 mg Oral QPC breakfast  . isosorbide mononitrate  60 mg Oral Daily  . mirtazapine  7.5 mg Oral QHS  . nystatin  5 mL Mouth/Throat QID  . pantoprazole  40 mg Oral Daily  . predniSONE  50 mg Oral Daily    have reviewed scheduled and prn medications.  Physical Exam: Exam not  performed due to covid positive status-  Notes from other providers reviewed  05/20/2020,8:38 AM  LOS: 6 days

## 2020-05-20 NOTE — Progress Notes (Signed)
PROGRESS NOTE    Rose Freeman  YJE:[id number] DOB: Jan 20, 1941 DOA: 05/14/2020 PCP: Neale Burly, MD   Chief Complaint  Patient presents with  . Weakness    Brief admission Narrative:  Rose Freeman  is a 80 y.o. female, with past medical history of CKD stage V, anemia, unspecified, CHF unspecified, hypothyroidism, hypertension, CAD status post CABG, patient presents to ED secondary to cough and weakness, she is unvaccinated against Covid, she presents with cough for last 4 days, patient is very poor historian, it was obtained from daughter by phone, patient with stage V kidney disease, being planned for peritoneal dialysis, upon presentation to ED work-up significant for creatinine of 28, potassium of 7.3, BUN of 177, and she is COVID-19 positive, with chest x-ray for multifocal opacity, but no oxygen requirement, she has anemia at 9.7, afebrile, no leukocytosis, normal lactic acid and D-dimer elevated at 8.9, [facility] hospitalist consulted to admit.  Assessment & Plan: 1-acute kidney injury on chronic kidney disease stage V: with hyperkalemia -Emergently started on dialysis -potassium 4.1 currently; continue telemetry monitoring. -Patient due for dialysis today which will further stabilize electrolytes. -Continue to follow nephrology service recommendations for further dialysis treatment. -If patient remains stable by tomorrow will hold further dialysis treatment to be initiated as an outpatient on 05/22/2020. -Continue to maintain adequate hydration. -Adjust medications to appropriate Creatinine clearance. -TDC placed by surgery on 05/16/20; planning to initiate clipping process next week. -anticipated next dialysis treatment on 05/19/2020.  2-anemia of chronic kidney disease -Will follow nephrology service recommendations for epogen and IV iron -No overt BLEEDING appreciated -Continue to follow Hgb trend  3-hx of CAD -no CP currently -continue telemetry monitoring -continue  Imdur  4-COVID-19 infection -family reported worsening SOB and hypoxia with ambulation -checking desaturation screening today. -continue tx with steroids (starting tapering process) -no fever, no complaints of SOB -patient has completed Remdesivir infusion. -Continue supportive care.  5-HTN -anticipating HD will help with BP control -continue adjusted dose of antihypertensive agents.  6-hyperphosphatemia -continue HD -continue sensipar   7-physical deconditioning  -will arrange for home health services at time of discharge -generalized weakness in the setting of COVID -19 infection and uremia.   8-chronic atrial fibrillation  -resume Eliquis 2.5 mg twice daily. -Patient's Procardia has been held secondary to soft blood pressure. -Follow vital signs and heart rate on telemetry prior to further resume at a lower dose.  9-thrush/esophageal candidiasis -Continue treatment with nystatin -Diet consistency adjusted to facilitate swallowing. -Patient reports less discomfort in her throat and is swallowing better.  DVT prophylaxis: heparin  Code Status: Full code. Family Communication: no family at bedside. Disposition:   Status is: Inpatient  Dispo: The patient is from: home.              Anticipated d/c is to: home with home health services.              Anticipated d/c date is: 1-2 days              Patient currently no medically stable for discharge; continue to follow nephrology guidelines for further hemodialysis and anticipated tunneled catheter placement next week.  Continue treatment with steroids steroids tapering; patient has completed remdesivir infusion.  At this moment she can be discharged if remains medically stable with anticipation for next dialysis as an outpatient on 05/22/2020.    Difficult to place patient : no    Consultants:   Nephrology service  General surgery for dialysis catheter placement.  Procedures:  See below  reports   Antimicrobials/antiviral -Remdesivir   Subjective: No chest pain, no nausea, no vomiting.  Good saturation on room air and denies shortness of breath.  Objective: Vitals:   05/19/20 2010 05/19/20 2122 05/20/20 0530 05/20/20 1343  BP: 138/76 119/72 (!) 144/80 (!) 155/120  Pulse: 90 89 76 88  Resp: 18 16 16 16   Temp:  98.2 F (36.8 C) 98 F (36.7 C) 98.3 F (36.8 C)  TempSrc:  Oral Oral Oral  SpO2:  95% 95% 97%  Weight:      Height:        Intake/Output Summary (Last 24 hours) at 05/20/2020 1724 Last data filed at 05/19/2020 2144 Gross per 24 hour  Intake 200 ml  Output 0 ml  Net 200 ml   Filed Weights   05/15/20 1645 05/15/20 1745 05/19/20 1620  Weight: 63.3 kg 63.3 kg 66.4 kg    Examination: General exam: Alert, awake, oriented x 2; no chest pain, no nausea, no vomiting.  Still no eating or drinking a whole lot but making progress.  Patient denies shortness of breath.  Good saturation on room air. Respiratory system: No wheezing, no crackles, no using accessory muscles. Cardiovascular system: Regular rate, no rubs, no gallops.  No JVD on exam. Gastrointestinal system: Abdomen is nondistended, soft and nontender. No organomegaly or masses felt. Normal bowel sounds heard. Central nervous system: No focal neurological deficits. Extremities: No cyanosis or clubbing. Skin: No rashes, no petechiae. Psychiatry: Mood & affect appropriate.    Data Reviewed: I have personally reviewed following labs and imaging studies  CBC: Recent Labs  Lab 05/15/20 0500 05/16/20 0318 05/17/20 0903 05/18/20 0642 05/19/20 0514  WBC 5.4 8.3 10.3 12.8* 20.8*  NEUTROABS 4.8 7.4 9.2* 10.8* 17.1*  HGB 9.7* 8.9* 11.1* 9.8* 9.5*  HCT 27.3* 25.8* 35.1* 31.9* 32.3*  MCV 98.2 98.1 95.9 97.0 101.3*  PLT 280 210 273 257 826    Basic Metabolic Panel: Recent Labs  Lab 05/14/20 1422 05/14/20 1425 05/16/20 0318 05/17/20 0903 05/18/20 0642 05/19/20 0514 05/20/20 0623  NA  --     < > 137 134* 133* 131* 132*  K  --    < > 4.0 3.8 4.3 5.3* 4.1  CL  --    < > 99 96* 96* 95* 95*  CO2  --    < > 23 23 23  17* 26  GLUCOSE  --    < > 149* 126* 154* 116* 119*  BUN  --    < > 86* 47* 67* 90* 40*  CREATININE  --    < > 14.76* 8.56* 10.38* 12.33* 6.46*  CALCIUM  --    < > 8.8* 9.0 8.3* 6.1* 8.1*  PHOS 8.5*  --   --   --   --   --  6.1*   < > = values in this interval not displayed.    GFR: Estimated Creatinine Clearance: 6.7 mL/min (A) (by C-G formula based on SCr of 6.46 mg/dL (H)).  Liver Function Tests: Recent Labs  Lab 05/15/20 0500 05/16/20 0318 05/17/20 0903 05/18/20 0642 05/19/20 0514 05/20/20 0623  AST 18 18 19 22 21   --   ALT 9 9 11 10 9   --   ALKPHOS 69 57 63 55 53  --   BILITOT 0.7 0.7 0.8 0.7 0.6  --   PROT 7.1 6.7 7.2 6.6 6.4*  --   ALBUMIN 2.6* 2.5* 2.9* 2.7* 2.6* 2.5*  CBG: Recent Labs  Lab 05/19/20 2021 05/20/20 0007 05/20/20 0407 05/20/20 0745 05/20/20 1118  GLUCAP 106* 138* 123* 115* 145*     Recent Results (from the past 240 hour(s))  Blood Culture (routine x 2)     Status: None   Collection Time: 05/14/20  2:23 PM   Specimen: BLOOD  Result Value Ref Range Status   Specimen Description BLOOD LEFT WRIST  Final   Special Requests   Final    BOTTLES DRAWN AEROBIC AND ANAEROBIC Blood Culture adequate volume   Culture   Final    NO GROWTH 5 DAYS Performed at [facility], [address], [place], [place] [address]    Report Status 05/19/2020 FINAL  Final  Blood Culture (routine x 2)     Status: None   Collection Time: 05/14/20  2:24 PM   Specimen: BLOOD  Result Value Ref Range Status   Specimen Description BLOOD LEFT HAND  Final   Special Requests   Final    BOTTLES DRAWN AEROBIC AND ANAEROBIC Blood Culture adequate volume   Culture   Final    NO GROWTH 5 DAYS Performed at [facility], [address], [place], [place] [address]    Report Status 05/19/2020 FINAL  Final  SARS Coronavirus 2 by RT PCR (hospital order,  performed in [place] hospital lab) Nasopharyngeal Nasopharyngeal Swab     Status: Abnormal   Collection Time: 05/14/20  2:28 PM   Specimen: Nasopharyngeal Swab  Result Value Ref Range Status   SARS Coronavirus 2 POSITIVE (A) NEGATIVE Final    Comment: RESULT CALLED TO, READ BACK BY AND VERIFIED WITH: CRAWFORD,H RN @1540  05/14/20 BY JONES,T (NOTE) SARS-CoV-2 target nucleic acids are DETECTED  SARS-CoV-2 RNA is generally detectable in upper respiratory specimens  during the acute phase of infection.  Positive results are indicative  of the presence of the identified virus, but do not rule out bacterial infection or co-infection with other pathogens not detected by the test.  Clinical correlation with patient history and  other diagnostic information is necessary to determine patient infection status.  The expected result is negative.  Fact Sheet for Patients:   StrictlyIdeas.no   Fact Sheet for Healthcare Providers:   BankingDealers.co.za    This test is not yet approved or cleared by the [place] FDA and  has been authorized for detection and/or diagnosis of SARS-CoV-2 by FDA under an Emergency Use Authorization (EUA).  This EUA will remain in effect (meaning this  test can be used) for the duration of  the COVID-19 declaration under Section 564(b)(1) of the Act, 21 U.S.C. section 360-bbb-3(b)(1), unless the authorization is terminated or revoked sooner.  Performed at [facility], [address], [place], [place] [address]   MRSA PCR Screening     Status: None   Collection Time: 05/15/20  4:45 PM   Specimen: Nasal Mucosa; Nasopharyngeal  Result Value Ref Range Status   MRSA by PCR NEGATIVE NEGATIVE Final    Comment:        The GeneXpert MRSA Assay (FDA approved for NASAL specimens only), is one component of a comprehensive MRSA colonization surveillance program. It is not intended to diagnose MRSA infection nor to guide  or monitor treatment for MRSA infections. Performed at [facility], [address], [place], [place] [address]      Radiology Studies: No results found. Scheduled Meds: . apixaban  2.5 mg Oral BID  . calcitRIOL  0.5 mcg Oral Daily  . calcium acetate  667 mg Oral TID WC  . Chlorhexidine Gluconate Cloth  6 each Topical Q0600  . Chlorhexidine Gluconate Cloth  6 each Topical Q0600  . cinacalcet  30 mg Oral Q breakfast  . darbepoetin (ARANESP) injection - DIALYSIS  40 mcg Intravenous Q Fri-HD  . exemestane  25 mg Oral QPC breakfast  . [START ON 05/21/2020] isosorbide mononitrate  30 mg Oral Daily  . mirtazapine  7.5 mg Oral QHS  . NIFEdipine  60 mg Oral Daily  . nystatin  5 mL Mouth/Throat QID  . pantoprazole  40 mg Oral Daily  . predniSONE  40 mg Oral Q breakfast   Continuous Infusions: . sodium chloride    . sodium chloride       LOS: 6 days    Time spent: 30 minutes  Barton Dubois, MD [facility]   To contact the attending provider between 7A-7P or the covering provider during after hours 7P-7A, please log into the web site [email or web address] and access using universal [place] password for that web site. If you do not have the password, please call the hospital operator.  05/20/2020, 5:24 PM

## 2020-05-21 DIAGNOSIS — Z95828 Presence of other vascular implants and grafts: Secondary | ICD-10-CM

## 2020-05-21 DIAGNOSIS — Z992 Dependence on renal dialysis: Secondary | ICD-10-CM

## 2020-05-21 DIAGNOSIS — I4891 Unspecified atrial fibrillation: Secondary | ICD-10-CM

## 2020-05-21 LAB — RENAL FUNCTION PANEL
Albumin: 2.4 g/dL — ABNORMAL LOW (ref 3.5–5.0)
Anion gap: 16 — ABNORMAL HIGH (ref 5–15)
BUN: 61 mg/dL — ABNORMAL HIGH (ref 8–23)
CO2: 19 mmol/L — ABNORMAL LOW (ref 22–32)
Calcium: 8.1 mg/dL — ABNORMAL LOW (ref 8.9–10.3)
Chloride: 97 mmol/L — ABNORMAL LOW (ref 98–111)
Creatinine, Ser: 8.32 mg/dL — ABNORMAL HIGH (ref 0.44–1.00)
GFR, Estimated: 5 mL/min — ABNORMAL LOW (ref >60)
Glucose, Bld: 106 mg/dL — ABNORMAL HIGH (ref 70–99)
Phosphorus: 6.7 mg/dL — ABNORMAL HIGH (ref 2.5–4.6)
Potassium: 5.1 mmol/L (ref 3.5–5.1)
Sodium: 132 mmol/L — ABNORMAL LOW (ref 135–145)

## 2020-05-21 LAB — GLUCOSE, CAPILLARY
Glucose-Capillary: 108 mg/dL — ABNORMAL HIGH (ref 70–99)
Glucose-Capillary: 123 mg/dL — ABNORMAL HIGH (ref 70–99)
Glucose-Capillary: 144 mg/dL — ABNORMAL HIGH (ref 70–99)

## 2020-05-21 LAB — CBC
HCT: 28.4 % — ABNORMAL LOW (ref 36.0–46.0)
Hemoglobin: 8.8 g/dL — ABNORMAL LOW (ref 12.0–15.0)
MCH: 30 pg (ref 26.0–34.0)
MCHC: 31 g/dL (ref 30.0–36.0)
MCV: 96.9 fL (ref 80.0–100.0)
Platelets: 287 10*3/uL (ref 150–400)
RBC: 2.93 MIL/uL — ABNORMAL LOW (ref 3.87–5.11)
RDW: 14.2 % (ref 11.5–15.5)
WBC: 22.1 10*3/uL — ABNORMAL HIGH (ref 4.0–10.5)
nRBC: 0.4 % — ABNORMAL HIGH (ref 0.0–0.2)

## 2020-05-21 MED ORDER — CALCIUM ACETATE (PHOS BINDER) 667 MG PO CAPS: 667.0000 mg | ORAL_CAPSULE | Freq: Three times a day (TID) | ORAL | 1 refills | Status: AC

## 2020-05-21 MED ORDER — HEPARIN SODIUM (PORCINE) 1000 UNIT/ML DIALYSIS: 20.0000 [IU]/kg | INTRAMUSCULAR | Status: DC | PRN

## 2020-05-21 MED ORDER — ISOSORBIDE MONONITRATE ER 30 MG PO TB24: 30.0000 mg | ORAL_TABLET | Freq: Every day | ORAL | 1 refills | Status: AC

## 2020-05-21 MED ORDER — PREDNISONE 20 MG PO TABS: ORAL_TABLET | ORAL | 0 refills | Status: AC

## 2020-05-21 MED ORDER — PANTOPRAZOLE SODIUM 40 MG PO TBEC: 40.0000 mg | DELAYED_RELEASE_TABLET | Freq: Every day | ORAL | 1 refills | Status: AC

## 2020-05-21 MED ORDER — NYSTATIN 100000 UNIT/ML MT SUSP: 5.0000 mL | Freq: Four times a day (QID) | OROMUCOSAL | 0 refills | Status: AC

## 2020-05-21 MED ORDER — CALCITRIOL 0.5 MCG PO CAPS: 0.5000 ug | ORAL_CAPSULE | Freq: Every day | ORAL | 1 refills | Status: AC

## 2020-05-21 MED ORDER — APIXABAN 2.5 MG PO TABS: 2.5000 mg | ORAL_TABLET | Freq: Two times a day (BID) | ORAL | 2 refills | Status: AC

## 2020-05-21 NOTE — Progress Notes (Signed)
Patient refused ECG this morning.

## 2020-05-21 NOTE — Progress Notes (Signed)
Issues with outpatient dialysis scheduling noted.  Will plan for HD today and then can follow up with outpatient dialysis on Friday 05/23/20.  She is stable for discharge to home after HD today.

## 2020-05-21 NOTE — Care Management Important Message (Signed)
Important Message  Patient Details  Name: Rose Freeman MRN: [id number] Date of Birth: Oct 17, 1940   Medicare Important Message Given:  Yes - Important Message mailed due to current National Emergency     Tommy Medal 05/21/2020, 11:46 AM

## 2020-05-21 NOTE — Progress Notes (Signed)
Chaplain rounding due to length of stay, COVID-19  Rose Freeman was welcoming of chaplain presence.  Reflected on her upcoming discharge and transition to dialysis.  Described previously feeling fear around dialysis due to lack of understanding, but currently feels at peace with this.  She shared prayers with chaplain around discharge and release of fear.       Rev. Jerene Pitch, MDiv, [facility]  Assistant Director  Spiritual Care and [place].

## 2020-05-21 NOTE — TOC Progression Note (Addendum)
Transition of Care [facility]) - Progression Note    Patient Details  Name: Rose Freeman MRN: [id number] Date of Birth: 1940-07-15  Transition of [facility]) CM/SW Contact  Ihor Gully, LCSW Phone Number: 05/21/2020, 2:46 PM  Clinical Narrative:    Patient has been scheduled for ongoing dialysis at [facility], [address], [place] [address], [phone]. Patient will have to go on the COVID schedule, MWF @ 4:30. Her first facility chair is scheduled for 05/23/20 @ 4:30, she has to be at the center at 4:15. Daughter advised of details. Cindy with [employer] to contact daughter to reiterate details.    Expected Discharge Plan: [place] Barriers to Discharge: Continued Medical Work up  Expected Discharge Plan and Services Expected Discharge Plan: [place]   Discharge Planning Services:  ([place], walker, [place], bsc)   Living arrangements for the past 2 months: Mobile Home                                       Social Determinants of Health (SDOH) Interventions    Readmission Risk Interventions No flowsheet data found.

## 2020-05-21 NOTE — Progress Notes (Addendum)
Patient ID: Rose Freeman, female   DOB: 1940-09-04, 80 y.o.   MRN: [id number]  [facility] Progress Note    Subjective:    No events overnight   Objective:   BP 106/64   Pulse 79   Temp 98 F (36.7 C) (Oral)   Resp 19   Ht 5\' 3"  (1.6 m)   Wt 66.4 kg   SpO2 97%   BMI 25.93 kg/m   Intake/Output: I/O last 3 completed shifts: In: 920 [P.O.:920] Out: 20 [Urine:20]   Intake/Output this shift:  No intake/output data recorded. Weight change:   Physical Exam: Physical exam: Not completed due to COVID + status.  In order to preserve PPE equipment and to minimize exposure to providers.  Notes from other caregivers reviewed   Labs: BMET Recent Labs  Lab 05/14/20 1422 05/14/20 1425 05/15/20 0500 05/16/20 0318 05/17/20 4081 05/18/20 4481 05/19/20 0514 05/20/20 0623 05/21/20 0508  NA  --    < > 138 137 134* 133* 131* 132* 132*  K  --    < > 4.7 4.0 3.8 4.3 5.3* 4.1 5.1  CL  --    < > 101 99 96* 96* 95* 95* 97*  CO2  --    < > 20* 23 23 23  17* 26 19*  GLUCOSE  --    < > 166* 149* 126* 154* 116* 119* 106*  BUN  --    < > 117* 86* 47* 67* 90* 40* 61*  CREATININE  --    < > 20.04* 14.76* 8.56* 10.38* 12.33* 6.46* 8.32*  ALBUMIN  --    < > 2.6* 2.5* 2.9* 2.7* 2.6* 2.5* 2.4*  CALCIUM  --    < > 8.6* 8.8* 9.0 8.3* 6.1* 8.1* 8.1*  PHOS 8.5*  --   --   --   --   --   --  6.1* 6.7*   < > = values in this interval not displayed.   CBC Recent Labs  Lab 05/16/20 0318 05/17/20 0903 05/18/20 0642 05/19/20 0514  WBC 8.3 10.3 12.8* 20.8*  NEUTROABS 7.4 9.2* 10.8* 17.1*  HGB 8.9* 11.1* 9.8* 9.5*  HCT 25.8* 35.1* 31.9* 32.3*  MCV 98.1 95.9 97.0 101.3*  PLT 210 273 257 224      Medications:    . apixaban  2.5 mg Oral BID  . calcitRIOL  0.5 mcg Oral Daily  . calcium acetate  667 mg Oral TID WC  . Chlorhexidine Gluconate Cloth  6 each Topical Q0600  . Chlorhexidine Gluconate Cloth  6 each Topical Q0600  . cinacalcet  30 mg Oral Q breakfast  .  darbepoetin (ARANESP) injection - DIALYSIS  40 mcg Intravenous Q Fri-HD  . exemestane  25 mg Oral QPC breakfast  . isosorbide mononitrate  30 mg Oral Daily  . mirtazapine  7.5 mg Oral QHS  . NIFEdipine  60 mg Oral Daily  . nystatin  5 mL Mouth/Throat QID  . pantoprazole  40 mg Oral Daily  . predniSONE  40 mg Oral Q breakfast     Assessment/ Plan:   1. Covid-19 pneumonitis- completed remdesivir and tapering steroids. 97% SpO2 on room air.    2. ESRD - set up for HD at [facility] on TTS second shift.  S/p HD on 05/19/20 and ok for discharge today and follow up for HD tomorrow as an outpatient. 3. 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 Nutrition: renal diet 6. Hyperkalemia-  improved with HD.  Ok to give dose of lokelma 10 grams today and HD tomorrow. 7. Hypertension: stable but procardia discontinued due to low BP.   8. Disposition - for discharge to home today and f/u with HD tomorrow as an outpatient in [place].   Donetta Potts, MD [place] Pager [phone] 05/21/2020, 11:02 AM

## 2020-05-21 NOTE — Plan of Care (Signed)

## 2020-05-21 NOTE — Discharge Summary (Signed)
Physician Discharge Summary  Rose Freeman WER:[id number] DOB: August 06, 1940 DOA: 05/14/2020  PCP: Neale Burly, MD  Admit date: 05/14/2020 Discharge date: 05/21/2020  Time spent: 35 minutes  Recommendations for Outpatient Follow-up:  1. Repeat CBC to follow hemoglobin trend 2. Close monitoring of patient renal function panel to follow electrolytes stability.   Discharge Diagnoses:  Principal Problem:   Acute renal failure superimposed on stage 5 chronic kidney disease, not on chronic dialysis (HCC) Active Problems:   Acute hyperkalemia   Accelerated hypertension   CKD (chronic kidney disease), stage V ([place])   COVID-19 virus infection   Status post insertion of dialysis catheter [place])   Atrial fibrillation ([place]) ESRD  Discharge Condition: Stable and improved.  Patient discharged home with instruction to follow-up with PCP and nephrology as an outpatient.  Arrangement for outpatient dialysis to begin on February 11 22, second shift.  CODE STATUS: Full code.  Diet recommendation: Heart healthy/renal diet.  Filed Weights   05/15/20 1645 05/15/20 1745 05/19/20 1620  Weight: 63.3 kg 63.3 kg 66.4 kg    History of present illness:  Rose Freeman a79 y.o.female,with past medical history of CKD stage V, anemia, unspecified, CHF unspecified, hypothyroidism, hypertension, CAD status post CABG, patient presents to ED secondary to cough and weakness, she is unvaccinated against Covid, she presents with cough for last 4 days, patient is very poor historian, it was obtained from daughter by phone, patient with stage V kidney disease, being planned for peritoneal dialysis, upon presentation to ED work-up significant for creatinine of 28, potassium of 7.3, BUN of 177, and she is COVID-19 positive, with chest x-ray for multifocal opacity, but no oxygen requirement, she has anemia at 9.7, afebrile, no leukocytosis, normal lactic acid and D-dimer elevated at 8.9, [facility] hospitalist consulted  to admit.  Hospital Course:  1-acute kidney injury on chronic kidney disease stage V: with hyperkalemia -Emergently started on dialysis; plans for PD dialysis to be discussed with primary nephrology as an outpatient. -Continue to follow nephrology service recommendations for further dialysis treatment. -Continue to maintain adequate hydration. -Adjust medications to appropriate Creatinine clearance. -TDC placed by surgery on 05/16/20; after adjusting outpatuient HD scheduled patient will be discharge with next HD on 05/23/20 -last inpatient HD (prior to discharge) on 05/22/20  2-anemia of chronic kidney disease -Will follow nephrology service recommendations for epogen and IV iron -No overt BLEEDING appreciated -Continue to follow Hgb trend intermittently   3-hx of CAD -no CP currently -continue adjusted dose of Imdur  4-COVID-19 infection -family reported worsening SOB and hypoxia with ambulation -no oxygen requirement needed. -continue tx with steroids (tapering process) -no fever, no complaints of SOB  5-HTN -anticipating HD will help with BP control as well -continue adjusted dose of antihypertensive agents. -advised to follow low sodium/renal diet.  6-hyperphosphatemia -continue HD -continue sensipar   7-physical deconditioning  -will arrange for home health services at time of discharge -generalized weakness in the setting of COVID -19 infection and uremia most likely.   8-chronic atrial fibrillation  -discharge on Eliquis 2.5 mg twice daily. -Patient's Procardia dose has been adjusted to minimize the chances of hypotension. -close monitoring of patient's HR and electrolytes recommended.   9-thrush/esophageal candidiasis -Continue treatment with nystatin -Diet consistency adjusted to facilitate swallowing. -Patient reports less discomfort in her throat and is swallowing better.  Procedures:  See below for x-ray reports   [facility] placement on  05/16/20  Consultations:  Nephrology service  General surgery  Discharge Exam: Vitals:   05/21/20  0736 05/21/20 1500  BP: 106/64 125/75  Pulse: 79 87  Resp:  18  Temp: 98 F (36.7 C) 98.5 F (36.9 C)  SpO2: 97% 97%    General: Afebrile, no chest pain, no nausea, no vomiting.  Tolerating diet without problem reporting significant improvement in her swallowing ability and sore throat. Cardiovascular: Rate controlled, no rubs, no gallops, no JVD on exam. Respiratory: Good air movement bilaterally; no wheezing, no crackles, no using accessory muscle.  Good saturation on room air. Abdomen: Soft, nontender, nondistended, positive bowel sounds Extremities: No cyanosis or clubbing.  Discharge Instructions   Discharge Instructions    Diet - low sodium heart healthy   Complete by: As directed    Discharge instructions   Complete by: As directed    Be compliant with outpatient hemodialysis treatment Follow low-sodium diet Maintain adequate hydration Take medications as prescribed Follow-up with PCP in 10 days.   Increase activity slowly   Complete by: As directed    No wound care   Complete by: As directed      Allergies as of 05/21/2020   No Known Allergies     Medication List    STOP taking these medications   furosemide 20 MG tablet Commonly known as: LASIX     TAKE these medications   albuterol 108 (90 Base) MCG/ACT inhaler Commonly known as: VENTOLIN HFA Inhale 2 puffs into the lungs every 4 (four) hours as needed for wheezing or shortness of breath.   apixaban 2.5 MG Tabs tablet Commonly known as: ELIQUIS Take 1 tablet (2.5 mg total) by mouth 2 (two) times daily.   calcitRIOL 0.5 MCG capsule Commonly known as: ROCALTROL Take 1 capsule (0.5 mcg total) by mouth daily. Start taking on: May 22, 2020   calcium acetate 667 MG capsule Commonly known as: PHOSLO Take 1 capsule (667 mg total) by mouth 3 (three) times daily with meals.   cinacalcet 30 MG  tablet Commonly known as: SENSIPAR Take 30 mg by mouth daily.   exemestane 25 MG tablet Commonly known as: AROMASIN Take 25 mg by mouth daily.   FeroSul 325 (65 FE) MG tablet Generic drug: ferrous sulfate Take 325 mg by mouth 2 (two) times daily.   isosorbide mononitrate 30 MG 24 hr tablet Commonly known as: IMDUR Take 1 tablet (30 mg total) by mouth daily. Start taking on: May 22, 2020 What changed:   medication strength  how much to take   mirtazapine 7.5 MG tablet Commonly known as: REMERON Take 7.5 mg by mouth at bedtime.   NIFEdipine 90 MG 24 hr tablet Commonly known as: PROCARDIA XL/NIFEDICAL-XL Take 90 mg by mouth daily.   nystatin 100000 UNIT/ML suspension Commonly known as: MYCOSTATIN Use as directed 5 mLs (500,000 Units total) in the mouth or throat 4 (four) times daily for 10 days.   pantoprazole 40 MG tablet Commonly known as: PROTONIX Take 1 tablet (40 mg total) by mouth daily. Start taking on: May 22, 2020   predniSONE 20 MG tablet Commonly known as: DELTASONE Take 2 tablets by mouth daily x2 days; then 1 tablet by mouth daily x3 days; then half tablet by mouth x 3 days and stop prednisone.      No Known Allergies  Follow-up Information    [place], [place]. Go to.   Why: Cain Saupe, Sat 12:00pm. Initial appointment to begin Febuary 10th, 2022 Arrive 30 minutes before appointment Contact information: [address] [place] [address] [phone]  Neale Burly, MD. Schedule an appointment as soon as possible for a visit in 10 day(s).   Specialty: Internal Medicine Contact information: [place] [phone]                The results of significant diagnostics from this hospitalization (including imaging, microbiology, ancillary and laboratory) are listed below for reference.    Significant Diagnostic Studies: US Venous Img Lower Bilateral (DVT)  Result Date:  05/15/2020 CLINICAL DATA:  Elevated D-dimer level. EXAM: BILATERAL LOWER EXTREMITY VENOUS DOPPLER ULTRASOUND TECHNIQUE: Gray-scale sonography with graded compression, as well as color Doppler and duplex ultrasound were performed to evaluate the lower extremity deep venous systems from the level of the common femoral vein and including the common femoral, femoral, profunda femoral, popliteal and calf veins including the posterior tibial, peroneal and gastrocnemius veins when visible. The superficial great saphenous vein was also interrogated. Spectral Doppler was utilized to evaluate flow at rest and with distal augmentation maneuvers in the common femoral, femoral and popliteal veins. COMPARISON:  None. FINDINGS: RIGHT LOWER EXTREMITY Common Femoral Vein: Not well visualized due to overlying bandages. Saphenofemoral Junction: Not well visualized due to overlying bandages. Profunda Femoral Vein: No evidence of thrombus. Normal compressibility and flow on color Doppler imaging. Femoral Vein: No evidence of thrombus. Normal compressibility, respiratory phasicity and response to augmentation. Popliteal Vein: No evidence of thrombus. Normal compressibility, respiratory phasicity and response to augmentation. Calf Veins: No evidence of thrombus. Normal compressibility and flow on color Doppler imaging. Superficial Great Saphenous Vein: No evidence of thrombus. Normal compressibility. Venous Reflux:  None. Other Findings:  None. LEFT LOWER EXTREMITY Common Femoral Vein: Not well visualized due to overlying bandages. Saphenofemoral Junction: Not well visualized due to overlying bandages. Profunda Femoral Vein: No evidence of thrombus. Normal compressibility and flow on color Doppler imaging. Femoral Vein: No evidence of thrombus. Normal compressibility, respiratory phasicity and response to augmentation. Popliteal Vein: No evidence of thrombus. Normal compressibility, respiratory phasicity and response to augmentation. Calf  Veins: No evidence of thrombus. Normal compressibility and flow on color Doppler imaging. Superficial Great Saphenous Vein: No evidence of thrombus. Normal compressibility. Venous Reflux:  None. Other Findings:  None. IMPRESSION: No evidence of deep venous thrombosis in either lower extremity within the visualized venous structures. The common femoral and saphenofemoral junctions bilaterally are not well visualized due to overlying bandages. Electronically Signed   By: Marijo Conception M.D.   On: 05/15/2020 10:08   DG Chest Portable 1 View  Result Date: 05/16/2020 CLINICAL DATA:  80 year old female with dialysis catheter placement. EXAM: PORTABLE CHEST 1 VIEW COMPARISON:  Chest radiograph dated 05/14/2020. FINDINGS: Right-sided dialysis catheter with tip at the cavoatrial junction. There is mild cardiomegaly with mild vascular congestion. Overall improved interstitial density seen on the prior radiograph. No focal consolidation, pleural effusion or pneumothorax. Atherosclerotic calcification of the aorta. Median sternotomy wires. No acute osseous pathology. IMPRESSION: 1. Dialysis catheter with tip at the cavoatrial junction. No pneumothorax. 2. Mild cardiomegaly with mild vascular congestion, improved since the prior radiograph. Electronically Signed   By: Anner Crete M.D.   On: 05/16/2020 15:56   DG Chest Port 1 View  Result Date: 05/14/2020 CLINICAL DATA:  Cough EXAM: PORTABLE CHEST 1 VIEW COMPARISON:  10/22/2019. FINDINGS: Mildly enlarged cardiac silhouette. Status post CABG and median sternotomy. Aortic atherosclerosis. New peripheral predominant interstitial opacities. Left basilar atelectasis. No visible pleural effusions or pneumothorax. IMPRESSION: New peripheral predominant interstitial opacities which could represent  atypical infection or interstitial edema. Left basilar atelectasis. Electronically Signed   By: Margaretha Sheffield MD   On: 05/14/2020 13:01   DG C-Arm 1-60 Min-No Report  Result  Date: 05/16/2020 Fluoroscopy was utilized by the requesting physician.  No radiographic interpretation.    Microbiology: Recent Results (from the past 240 hour(s))  Blood Culture (routine x 2)     Status: None   Collection Time: 05/14/20  2:23 PM   Specimen: BLOOD  Result Value Ref Range Status   Specimen Description BLOOD LEFT WRIST  Final   Special Requests   Final    BOTTLES DRAWN AEROBIC AND ANAEROBIC Blood Culture adequate volume   Culture   Final    NO GROWTH 5 DAYS Performed at [facility], [address], [place], [place] [address]    Report Status 05/19/2020 FINAL  Final  Blood Culture (routine x 2)     Status: None   Collection Time: 05/14/20  2:24 PM   Specimen: BLOOD  Result Value Ref Range Status   Specimen Description BLOOD LEFT HAND  Final   Special Requests   Final    BOTTLES DRAWN AEROBIC AND ANAEROBIC Blood Culture adequate volume   Culture   Final    NO GROWTH 5 DAYS Performed at [facility], [address] [place], [place] [address]    Report Status 05/19/2020 FINAL  Final  SARS Coronavirus 2 by RT PCR (hospital order, performed in [place] hospital lab) Nasopharyngeal Nasopharyngeal Swab     Status: Abnormal   Collection Time: 05/14/20  2:28 PM   Specimen: Nasopharyngeal Swab  Result Value Ref Range Status   SARS Coronavirus 2 POSITIVE (A) NEGATIVE Final    Comment: RESULT CALLED TO, READ BACK BY AND VERIFIED WITH: CRAWFORD,H RN @1540  05/14/20 BY JONES,T (NOTE) SARS-CoV-2 target nucleic acids are DETECTED  SARS-CoV-2 RNA is generally detectable in upper respiratory specimens  during the acute phase of infection.  Positive results are indicative  of the presence of the identified virus, but do not rule out bacterial infection or co-infection with other pathogens not detected by the test.  Clinical correlation with patient history and  other diagnostic information is necessary to determine patient infection status.  The expected result is  negative.  Fact Sheet for Patients:   StrictlyIdeas.no   Fact Sheet for Healthcare Providers:   BankingDealers.co.za    This test is not yet approved or cleared by the [place] FDA and  has been authorized for detection and/or diagnosis of SARS-CoV-2 by FDA under an Emergency Use Authorization (EUA).  This EUA will remain in effect (meaning this  test can be used) for the duration of  the COVID-19 declaration under Section 564(b)(1) of the Act, 21 U.S.C. section 360-bbb-3(b)(1), unless the authorization is terminated or revoked sooner.  Performed at [facility], [address], [place], [place] [address]   MRSA PCR Screening     Status: None   Collection Time: 05/15/20  4:45 PM   Specimen: Nasal Mucosa; Nasopharyngeal  Result Value Ref Range Status   MRSA by PCR NEGATIVE NEGATIVE Final    Comment:        The GeneXpert MRSA Assay (FDA approved for NASAL specimens only), is one component of a comprehensive MRSA colonization surveillance program. It is not intended to diagnose MRSA infection nor to guide or monitor treatment for MRSA infections. Performed at [facility], [address], [place], [place] [address]      Labs: Basic Metabolic Panel:  Recent Labs  Lab 05/17/20 0903 05/18/20 0642 05/19/20 0514 05/20/20 0623 05/21/20 0508  NA 134* 133* 131* 132* 132*  K 3.8 4.3 5.3* 4.1 5.1  CL 96* 96* 95* 95* 97*  CO2 23 23 17* 26 19*  GLUCOSE 126* 154* 116* 119* 106*  BUN 47* 67* 90* 40* 61*  CREATININE 8.56* 10.38* 12.33* 6.46* 8.32*  CALCIUM 9.0 8.3* 6.1* 8.1* 8.1*  PHOS  --   --   --  6.1* 6.7*   Liver Function Tests: Recent Labs  Lab 05/15/20 0500 05/16/20 0318 05/17/20 0903 05/18/20 0642 05/19/20 0514 05/20/20 0623 05/21/20 0508  AST 18 18 19 22 21   --   --   ALT 9 9 11 10 9   --   --   ALKPHOS 69 57 63 55 53  --   --   BILITOT 0.7 0.7 0.8 0.7 0.6  --   --   PROT 7.1 6.7 7.2 6.6 6.4*  --   --    ALBUMIN 2.6* 2.5* 2.9* 2.7* 2.6* 2.5* 2.4*   No results for input(s): LIPASE, AMYLASE in the last 168 hours. No results for input(s): AMMONIA in the last 168 hours. CBC: Recent Labs  Lab 05/15/20 0500 05/16/20 0318 05/17/20 0903 05/18/20 0642 05/19/20 0514  WBC 5.4 8.3 10.3 12.8* 20.8*  NEUTROABS 4.8 7.4 9.2* 10.8* 17.1*  HGB 9.7* 8.9* 11.1* 9.8* 9.5*  HCT 27.3* 25.8* 35.1* 31.9* 32.3*  MCV 98.2 98.1 95.9 97.0 101.3*  PLT 280 210 273 257 224   Cardiac Enzymes: No results for input(s): CKTOTAL, CKMB, CKMBINDEX, TROPONINI in the last 168 hours. BNP: BNP (last 3 results) No results for input(s): BNP in the last 8760 hours.  ProBNP (last 3 results) No results for input(s): PROBNP in the last 8760 hours.  CBG: Recent Labs  Lab 05/20/20 1118 05/20/20 1724 05/20/20 2046 05/21/20 0749 05/21/20 1131  GLUCAP 145* 158* 166* 108* 123*       Signed:  Barton Dubois MD.  [facility] 05/21/2020, 3:22 PM

## 2020-05-22 LAB — GLUCOSE, CAPILLARY: Glucose-Capillary: 77 mg/dL (ref 70–99)

## 2020-05-22 LAB — HEPATITIS B SURFACE ANTIBODY, QUANTITATIVE: Hep B S AB Quant (Post): <3.1 m[IU]/mL — ABNORMAL LOW (ref >9.9)

## 2020-05-22 NOTE — Progress Notes (Signed)
Patient can be discharged after dialysis. Spoke with patient;s daughter, she stated she would prefer to pick patient up in the morning. Dr Josephine Cables made aware and stated that it was ok. Called patient's daughter back to confirm that patient's discharge in the morning. The daughter states she will pick up patient at 8am.

## 2020-05-22 NOTE — Plan of Care (Signed)

## 2020-05-22 NOTE — Procedures (Signed)
   HEMODIALYSIS TREATMENT NOTE:  3 hour low-heparin HD completed.  Soft pressures; unable to tolerate UF.  Net even.  All blood returned.  Rockwell Alexandria, RN

## 2020-05-22 NOTE — Progress Notes (Signed)
  Pt was initially dced home on 05/21/20- by Dr Elbert Ewings daughter could not pick her up until 05/22/20  -Pt was not seen by Hospitalist team on 05/22/20  -- Please see full dc summary from Dr. Dyann Kief dated 05/21/20   Roxan Hockey, MD

## 2022-10-19 IMAGING — US US EXTREM LOW VENOUS
1 series · 13 of 24 positions shown · non-contrast
Comparison: None.

CLINICAL DATA: Elevated D-dimer level.



[Series 1: us extrem low venous · 0.08mm/px · 13 of 65 slices shown]
[im 1/65]
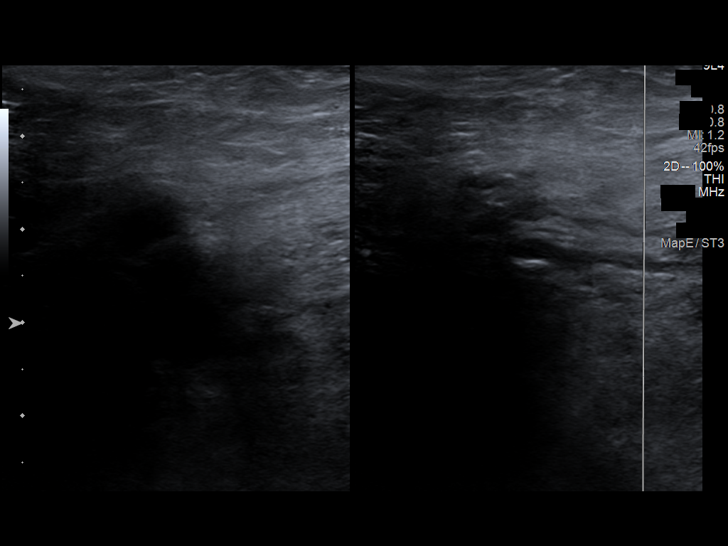
[im 6/65]
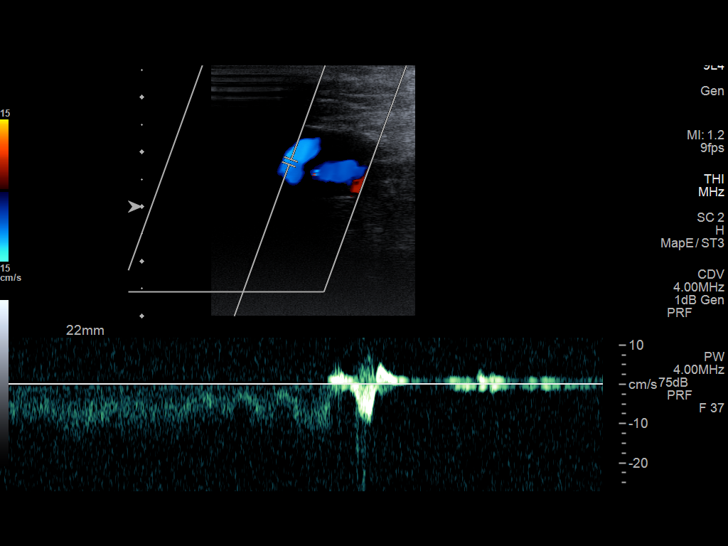
[im 12/65]
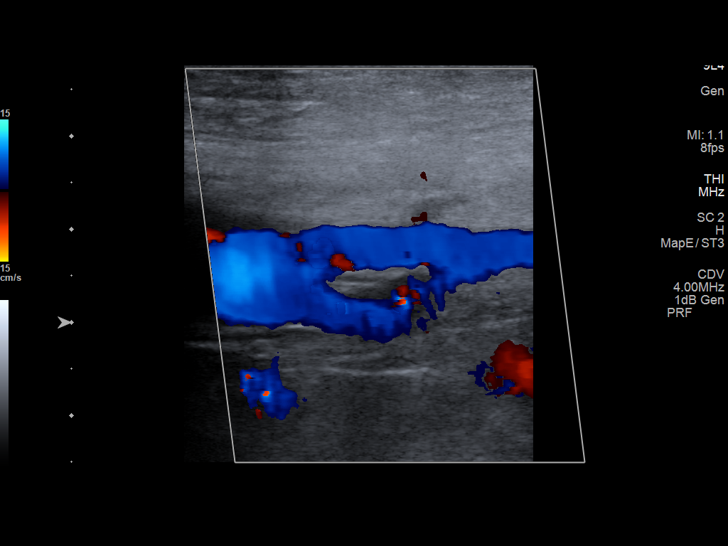
[im 17/65]
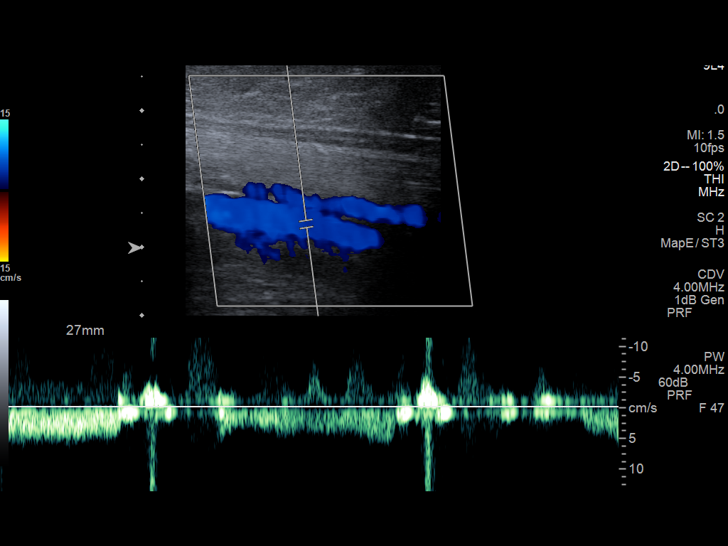
[im 23/65]
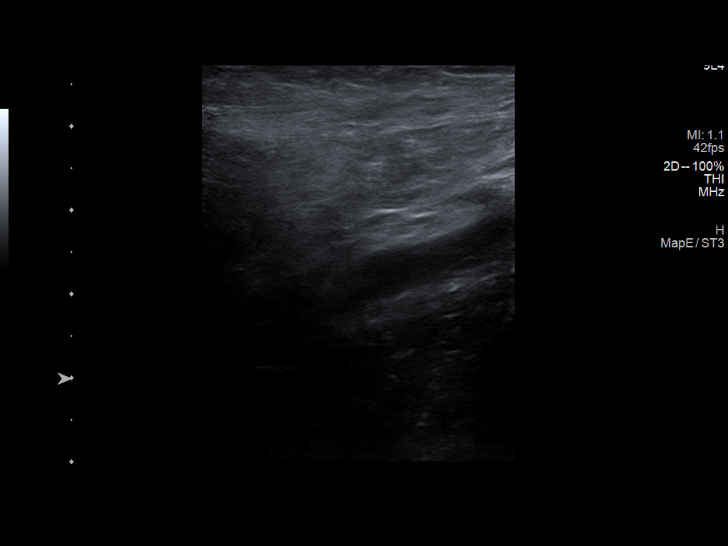
[im 28/65]
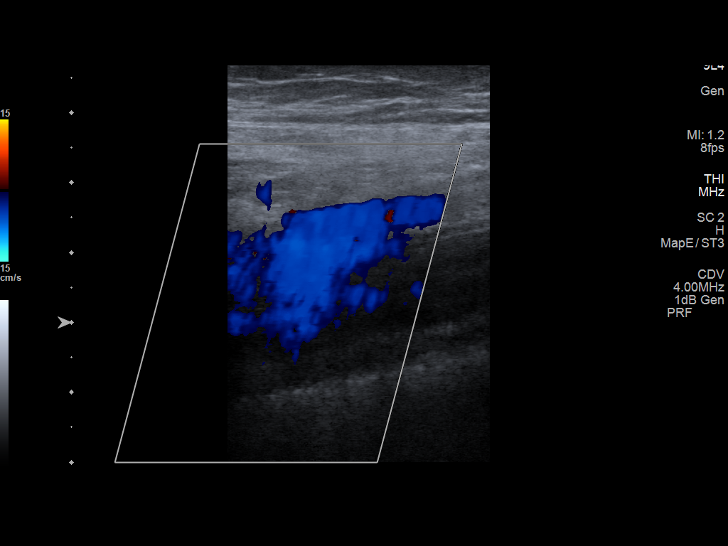
[im 34/65]
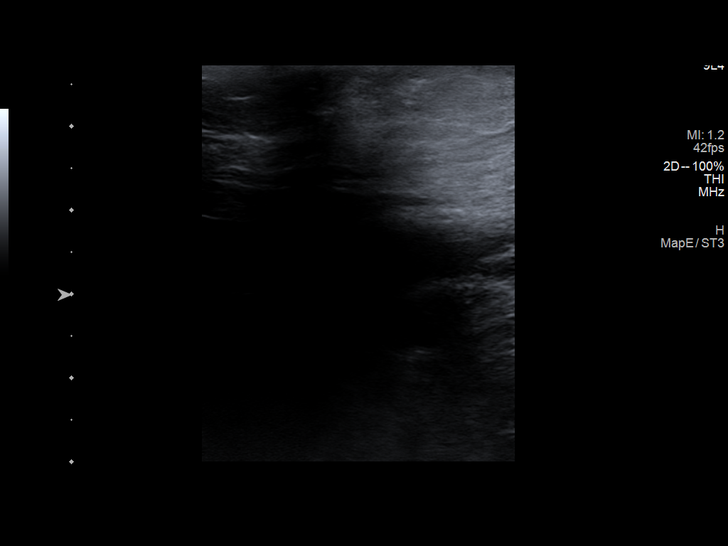
[im 37/65]
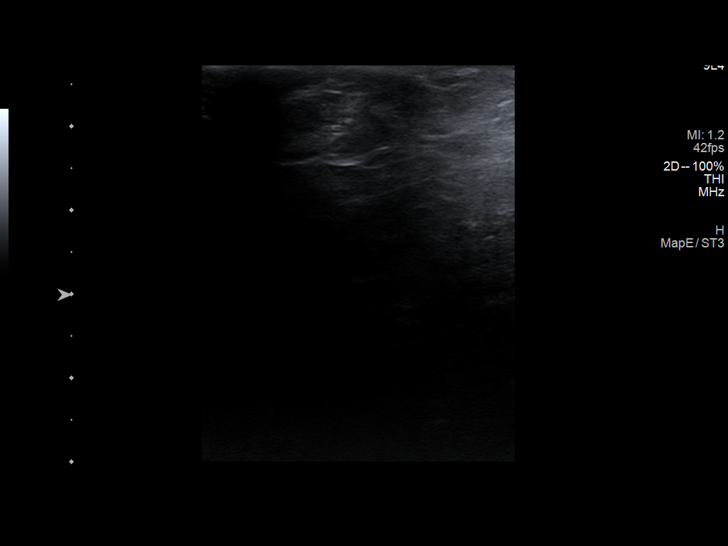
[im 42/65]
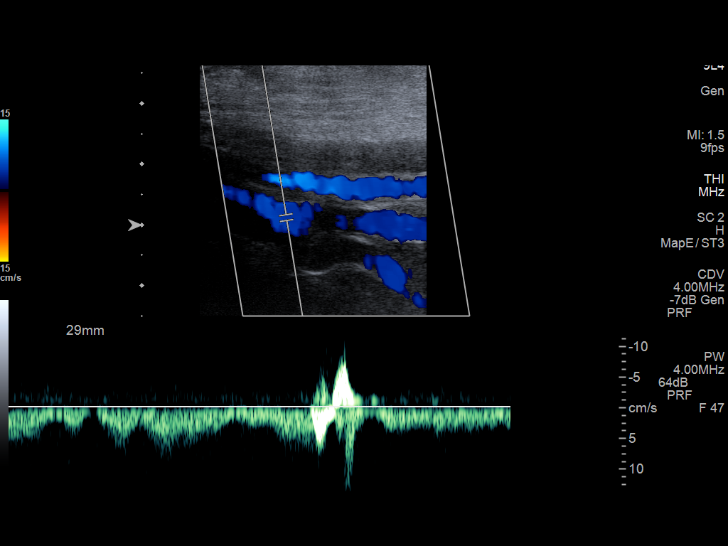
[im 48/65]
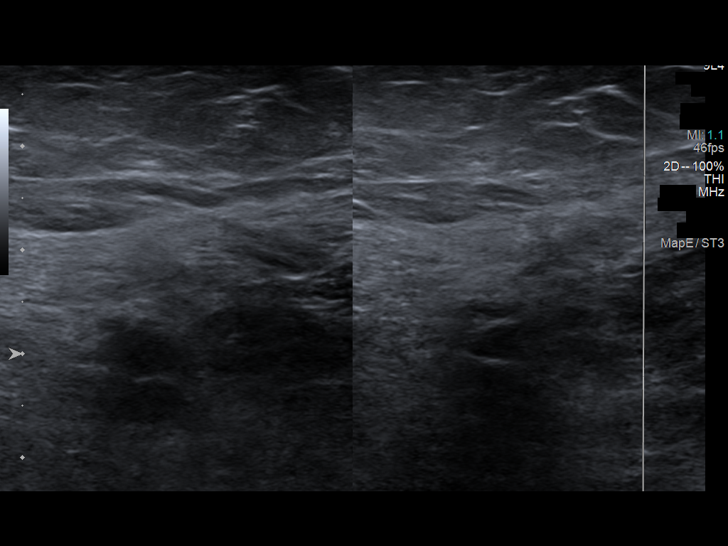
[im 53/65]
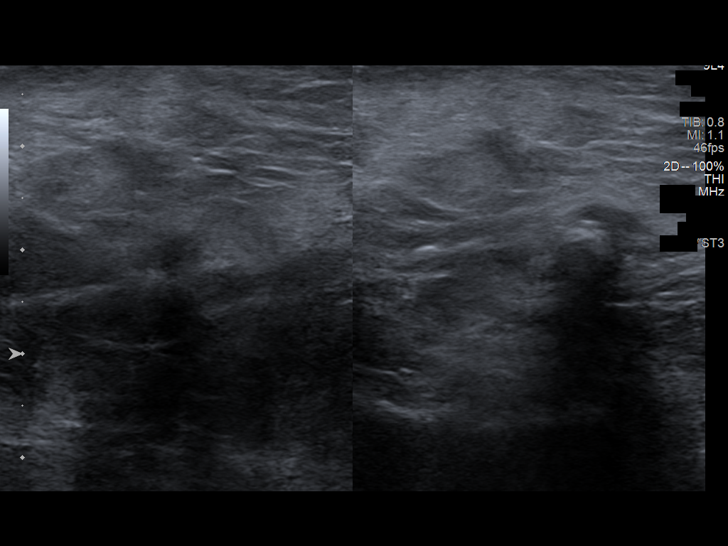
[im 59/65]
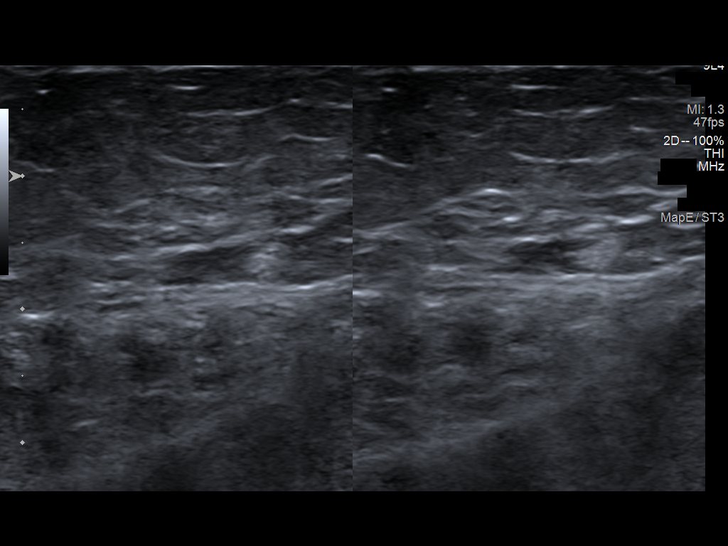
[im 65/65]
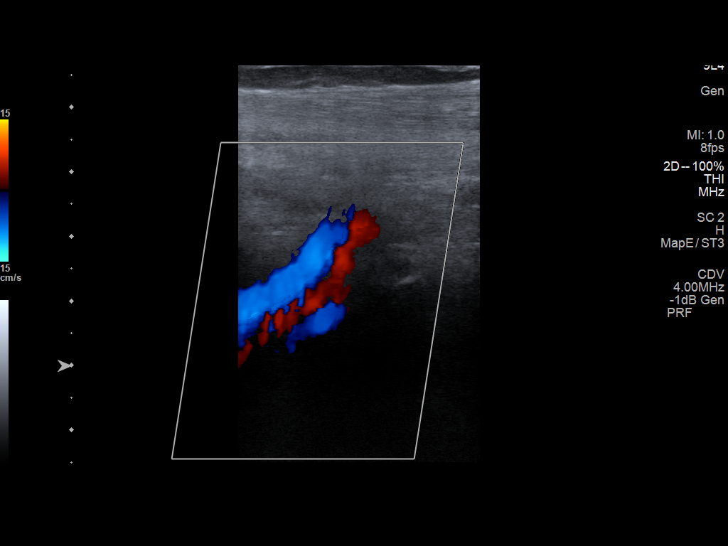

[13 of 24 positions shown; findings below may reference images not displayed]

FINDINGS: RIGHT LOWER EXTREMITY

Common Femoral Vein: Not well visualized due to overlying bandages.

Saphenofemoral Junction: Not well visualized due to overlying
bandages.

Profunda Femoral Vein: No evidence of thrombus. Normal
compressibility and flow on color Doppler imaging.

Femoral Vein: No evidence of thrombus. Normal compressibility,
respiratory phasicity and response to augmentation.

Popliteal Vein: No evidence of thrombus. Normal compressibility,
respiratory phasicity and response to augmentation.

Calf Veins: No evidence of thrombus. Normal compressibility and flow
on color Doppler imaging.

Superficial Great Saphenous Vein: No evidence of thrombus. Normal
compressibility.

Venous Reflux:  None.

Other Findings:  None.

LEFT LOWER EXTREMITY

Common Femoral Vein: Not well visualized due to overlying bandages.

Saphenofemoral Junction: Not well visualized due to overlying
bandages.

Profunda Femoral Vein: No evidence of thrombus. Normal
compressibility and flow on color Doppler imaging.

Femoral Vein: No evidence of thrombus. Normal compressibility,
respiratory phasicity and response to augmentation.

Popliteal Vein: No evidence of thrombus. Normal compressibility,
respiratory phasicity and response to augmentation.

Calf Veins: No evidence of thrombus. Normal compressibility and flow
on color Doppler imaging.

Superficial Great Saphenous Vein: No evidence of thrombus. Normal
compressibility.

Venous Reflux:  None.

Other Findings:  None.
IMPRESSION: No evidence of deep venous thrombosis in either lower extremity
within the visualized venous structures. The common femoral and
saphenofemoral junctions bilaterally are not well visualized due to
overlying bandages.

## 2022-10-20 IMAGING — DX DG CHEST 1V PORT
1 series · 1 of 1 positions shown · non-contrast
Comparison: Chest radiograph dated 05/14/2020.

CLINICAL DATA: 79-year-old female with dialysis catheter placement.

EXAM:
PORTABLE CHEST 1 VIEW

[chest ap]
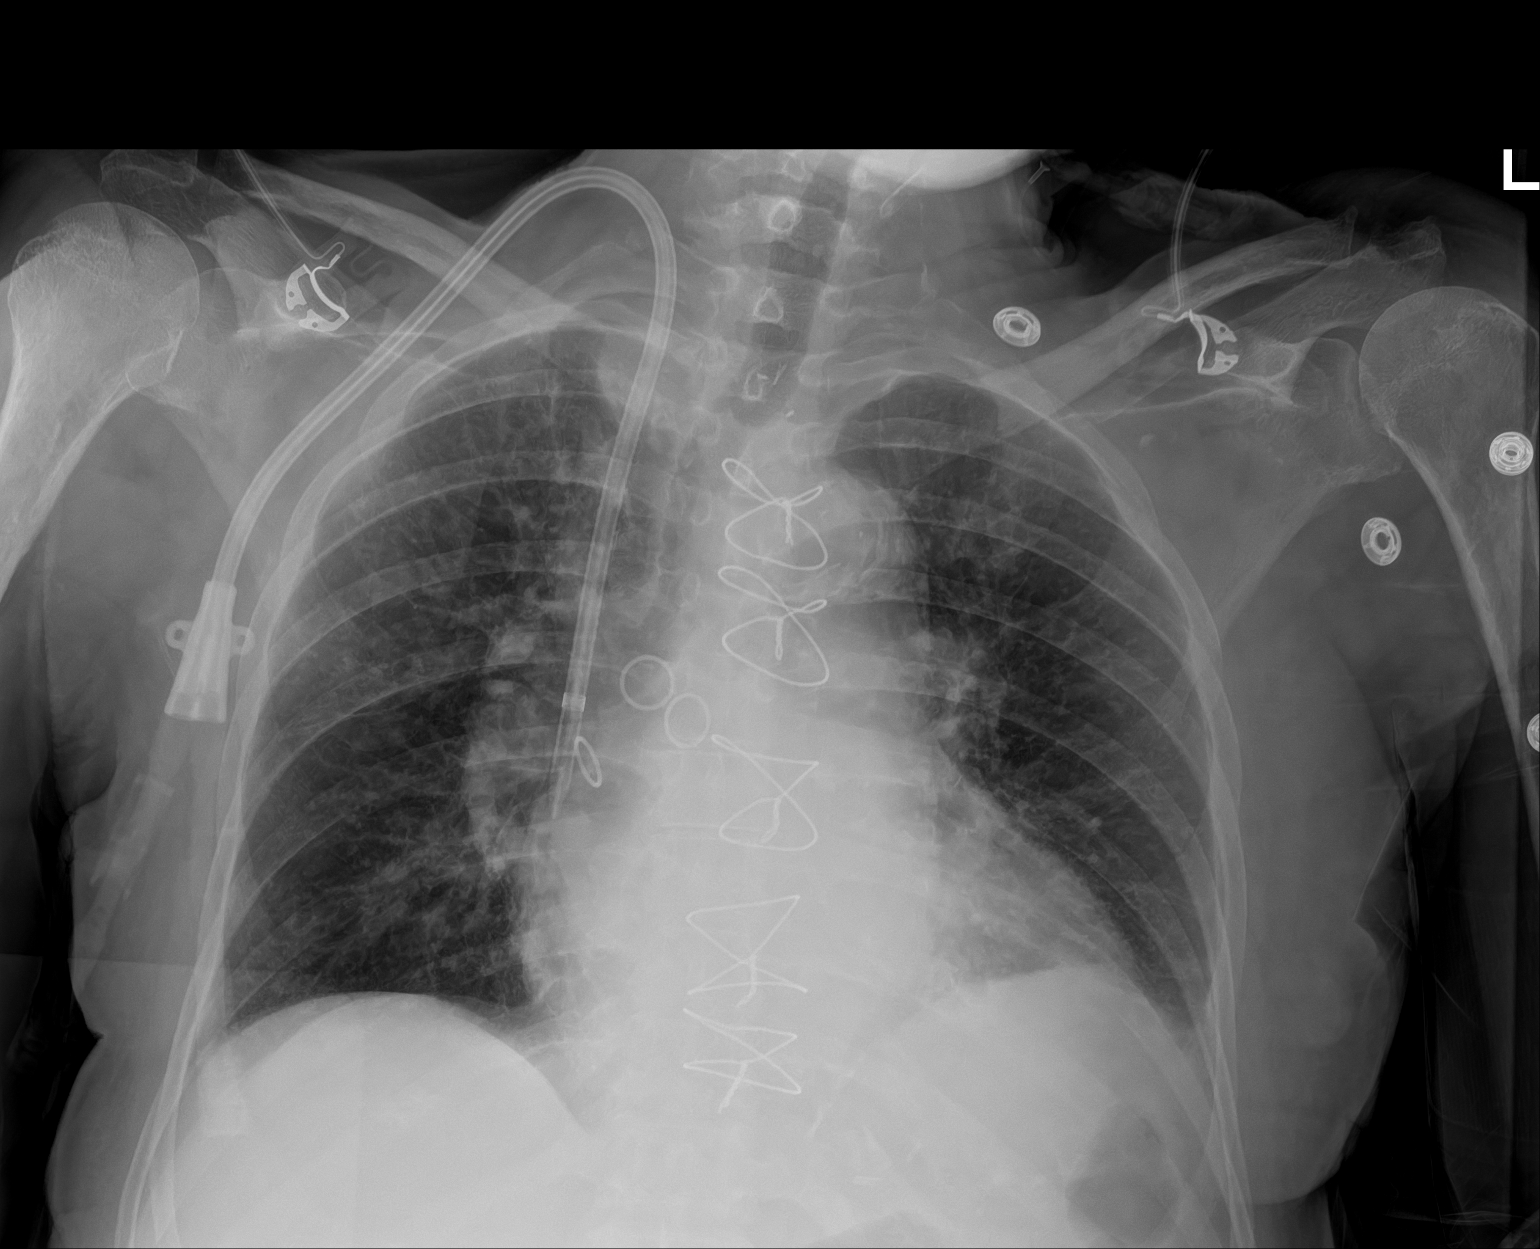

[1 of 1 positions shown; findings below may reference images not displayed]

FINDINGS: Right-sided dialysis catheter with tip at the cavoatrial junction.
There is mild cardiomegaly with mild vascular congestion. Overall
improved interstitial density seen on the prior radiograph. No focal
consolidation, pleural effusion or pneumothorax. Atherosclerotic
calcification of the aorta. Median sternotomy wires. No acute
osseous pathology.
IMPRESSION: 1. Dialysis catheter with tip at the cavoatrial junction. No
pneumothorax.
2. Mild cardiomegaly with mild vascular congestion, improved since
the prior radiograph.

## 2023-10-11 DEATH — deceased
